# Patient Record
Sex: Male | Born: 1935 | Race: White | Hispanic: No | Marital: Married | Smoking: Former smoker
Health system: Southern US, Community
[De-identification: ages and names within clinical notes are randomized; demographics above are authoritative.]

## PROBLEM LIST (undated history)

## (undated) DIAGNOSIS — I639 Cerebral infarction, unspecified: Secondary | ICD-10-CM

## (undated) DIAGNOSIS — D649 Anemia, unspecified: Secondary | ICD-10-CM

## (undated) DIAGNOSIS — I6529 Occlusion and stenosis of unspecified carotid artery: Secondary | ICD-10-CM

## (undated) DIAGNOSIS — J189 Pneumonia, unspecified organism: Secondary | ICD-10-CM

## (undated) DIAGNOSIS — T4145XA Adverse effect of unspecified anesthetic, initial encounter: Secondary | ICD-10-CM

## (undated) DIAGNOSIS — M1990 Unspecified osteoarthritis, unspecified site: Secondary | ICD-10-CM

## (undated) DIAGNOSIS — I10 Essential (primary) hypertension: Secondary | ICD-10-CM

## (undated) DIAGNOSIS — I219 Acute myocardial infarction, unspecified: Secondary | ICD-10-CM

## (undated) DIAGNOSIS — T8859XA Other complications of anesthesia, initial encounter: Secondary | ICD-10-CM

## (undated) DIAGNOSIS — I2510 Atherosclerotic heart disease of native coronary artery without angina pectoris: Secondary | ICD-10-CM

## (undated) HISTORY — DX: Occlusion and stenosis of unspecified carotid artery: I65.29

## (undated) HISTORY — PX: EYE SURGERY: SHX253

## (undated) HISTORY — PX: JOINT REPLACEMENT: SHX530

## (undated) HISTORY — PX: CARPAL TUNNEL RELEASE: SHX101

## (undated) HISTORY — PX: APPENDECTOMY: SHX54

## (undated) HISTORY — PX: HAND SURGERY: SHX662

## (undated) HISTORY — PX: BACK SURGERY: SHX140

## (undated) HISTORY — PX: ROTATOR CUFF REPAIR: SHX139

---

## 2008-09-03 HISTORY — PX: CORONARY ARTERY BYPASS GRAFT: SHX141

## 2009-02-23 DIAGNOSIS — I219 Acute myocardial infarction, unspecified: Secondary | ICD-10-CM

## 2009-02-23 HISTORY — DX: Acute myocardial infarction, unspecified: I21.9

## 2009-02-24 ENCOUNTER — Ambulatory Visit: Payer: Self-pay | Admitting: Thoracic Surgery (Cardiothoracic Vascular Surgery)

## 2009-03-16 ENCOUNTER — Ambulatory Visit: Payer: Self-pay | Admitting: Thoracic Surgery (Cardiothoracic Vascular Surgery)

## 2009-04-04 ENCOUNTER — Ambulatory Visit: Payer: Self-pay | Admitting: Thoracic Surgery (Cardiothoracic Vascular Surgery)

## 2009-05-10 ENCOUNTER — Ambulatory Visit: Payer: Self-pay | Admitting: Thoracic Surgery (Cardiothoracic Vascular Surgery)

## 2009-05-17 ENCOUNTER — Ambulatory Visit: Payer: Self-pay | Admitting: Thoracic Surgery (Cardiothoracic Vascular Surgery)

## 2011-01-10 ENCOUNTER — Other Ambulatory Visit: Payer: Self-pay | Admitting: Neurosurgery

## 2011-01-10 DIAGNOSIS — M545 Low back pain: Secondary | ICD-10-CM

## 2011-01-15 ENCOUNTER — Other Ambulatory Visit: Payer: Self-pay

## 2011-01-16 ENCOUNTER — Ambulatory Visit
Admission: RE | Admit: 2011-01-16 | Discharge: 2011-01-16 | Disposition: A | Discharge status: Home / Self Care | Payer: Medicare PPO | Source: Ambulatory Visit | Attending: Neurosurgery | Admitting: Neurosurgery

## 2011-01-16 DIAGNOSIS — M545 Low back pain: Secondary | ICD-10-CM

## 2011-03-08 ENCOUNTER — Ambulatory Visit (HOSPITAL_COMMUNITY)
Admission: RE | Admit: 2011-03-08 | Discharge: 2011-03-08 | Disposition: A | Discharge status: Home / Self Care | Payer: Medicare PPO | Source: Ambulatory Visit | Attending: Neurosurgery | Admitting: Neurosurgery

## 2011-03-08 ENCOUNTER — Other Ambulatory Visit (HOSPITAL_COMMUNITY): Payer: Self-pay | Admitting: Neurosurgery

## 2011-03-08 ENCOUNTER — Encounter (HOSPITAL_COMMUNITY)
Admission: RE | Admit: 2011-03-08 | Discharge: 2011-03-08 | Disposition: A | Discharge status: Home / Self Care | Payer: Medicare PPO | Source: Ambulatory Visit | Attending: Neurosurgery | Admitting: Neurosurgery

## 2011-03-08 DIAGNOSIS — Z01818 Encounter for other preprocedural examination: Secondary | ICD-10-CM | POA: Insufficient documentation

## 2011-03-08 DIAGNOSIS — Z01812 Encounter for preprocedural laboratory examination: Secondary | ICD-10-CM | POA: Insufficient documentation

## 2011-03-08 DIAGNOSIS — Z01810 Encounter for preprocedural cardiovascular examination: Secondary | ICD-10-CM | POA: Insufficient documentation

## 2011-03-08 DIAGNOSIS — M48061 Spinal stenosis, lumbar region without neurogenic claudication: Secondary | ICD-10-CM | POA: Insufficient documentation

## 2011-03-08 DIAGNOSIS — M419 Scoliosis, unspecified: Secondary | ICD-10-CM

## 2011-03-08 DIAGNOSIS — M4120 Other idiopathic scoliosis, site unspecified: Secondary | ICD-10-CM | POA: Insufficient documentation

## 2011-03-08 DIAGNOSIS — M4807 Spinal stenosis, lumbosacral region: Secondary | ICD-10-CM

## 2011-03-08 LAB — CBC
HCT: 39.2 % (ref 39.0–52.0)
MCH: 30.3 pg (ref 26.0–34.0)
MCHC: 33.7 g/dL (ref 30.0–36.0)
MCV: 89.9 fL (ref 78.0–100.0)
Platelets: 157 10*3/uL (ref 150–400)
RDW: 13.1 % (ref 11.5–15.5)
WBC: 5.8 10*3/uL (ref 4.0–10.5)

## 2011-03-08 LAB — DIFFERENTIAL
Eosinophils Absolute: 0.2 10*3/uL (ref 0.0–0.7)
Eosinophils Relative: 4 % (ref 0–5)
Lymphocytes Relative: 24 % (ref 12–46)
Lymphs Abs: 1.4 10*3/uL (ref 0.7–4.0)
Monocytes Absolute: 0.5 10*3/uL (ref 0.1–1.0)

## 2011-03-08 LAB — BASIC METABOLIC PANEL
CO2: 31 mEq/L (ref 19–32)
Chloride: 101 mEq/L (ref 96–112)
Potassium: 4.2 mEq/L (ref 3.5–5.1)

## 2011-03-08 LAB — SURGICAL PCR SCREEN
MRSA, PCR: NEGATIVE
Staphylococcus aureus: NEGATIVE

## 2011-03-12 ENCOUNTER — Inpatient Hospital Stay (HOSPITAL_COMMUNITY)
Admission: RE | Admit: 2011-03-12 | Discharge: 2011-03-18 | DRG: 457 | Disposition: A | Discharge status: Home / Self Care | Payer: Medicare PPO | Source: Ambulatory Visit | Attending: Neurosurgery | Admitting: Neurosurgery

## 2011-03-12 ENCOUNTER — Inpatient Hospital Stay (HOSPITAL_COMMUNITY): Payer: Medicare PPO

## 2011-03-12 DIAGNOSIS — M4120 Other idiopathic scoliosis, site unspecified: Principal | ICD-10-CM | POA: Diagnosis present

## 2011-03-12 DIAGNOSIS — D62 Acute posthemorrhagic anemia: Secondary | ICD-10-CM | POA: Diagnosis not present

## 2011-03-12 DIAGNOSIS — M48061 Spinal stenosis, lumbar region without neurogenic claudication: Secondary | ICD-10-CM | POA: Diagnosis present

## 2011-03-13 LAB — CBC
MCH: 30.9 pg (ref 26.0–34.0)
MCV: 90.2 fL (ref 78.0–100.0)
Platelets: 99 10*3/uL — ABNORMAL LOW (ref 150–400)
RBC: 2.85 MIL/uL — ABNORMAL LOW (ref 4.22–5.81)

## 2011-03-13 LAB — POCT I-STAT 4, (NA,K, GLUC, HGB,HCT)
Hemoglobin: 9.9 g/dL — ABNORMAL LOW (ref 13.0–17.0)
Sodium: 140 mEq/L (ref 135–145)

## 2011-03-13 LAB — BASIC METABOLIC PANEL
CO2: 28 mEq/L (ref 19–32)
Calcium: 7.6 mg/dL — ABNORMAL LOW (ref 8.4–10.5)
Creatinine, Ser: 0.71 mg/dL (ref 0.50–1.35)
Glucose, Bld: 222 mg/dL — ABNORMAL HIGH (ref 70–99)

## 2011-03-15 LAB — GLUCOSE, CAPILLARY: Glucose-Capillary: 136 mg/dL — ABNORMAL HIGH (ref 70–99)

## 2011-03-15 LAB — TYPE AND SCREEN
ABO/RH(D): B NEG
Antibody Screen: NEGATIVE

## 2011-03-16 LAB — GLUCOSE, CAPILLARY: Glucose-Capillary: 146 mg/dL — ABNORMAL HIGH (ref 70–99)

## 2011-03-22 NOTE — Op Note (Signed)
NAME:  [name], [name] NO.:  [id number]  MEDICAL RECORD NO.:  [id number]  LOCATION:  3013                         FACILITY:  [facility]  PHYSICIAN:  [name], M.D.    DATE OF BIRTH:  15-Jun-1933  DATE OF PROCEDURE:  03/12/2011 DATE OF DISCHARGE:                              OPERATIVE REPORT   PREOPERATIVE DIAGNOSIS:  L1-L5 degenerative scoliosis with stenosis.  POSTOPERATIVE DIAGNOSIS:  L1-L5 degenerative scoliosis with stenosis.  PROCEDURE NOTE:  L1-2, L2-3, L3-4, L4-5 decompressive laminectomy, bilateral L1, L2, L3, L4, L5 decompressive foraminotomies, more than would be required for simple interbody fusion alone.  L1-2, L2-3, L3-4, L4-5 posterior lumbar interbody fusion utilizing tangent interbody allograft wedge, Telamon interbody PEEK cage, and local autografting. L1-L5 posterolateral arthrodesis utilizing segmental pedicle screw fixation and local autograft.  SURGEON:  Brandon Maser. Calab Sachse, MD  ASSISTANT:  Donalee Citrin, MD  ANESTHESIA:  General endotracheal.  INDICATION:  [name] is a 77 year old male with history of intractable back and lower extremity pain, failing conservative measure. Workup demonstrates evidence of a progressive degenerative scoliosis with stenosis at multiple levels.  The patient has failed conservative management, presents now for L1-L5 decompression and fusion in hopes of improving his symptoms.  OPERATIVE NOTE:  The patient was brought to the operative room, placed on operating table in supine position.  After adequate level of anesthesia was achieved, the patient was placed prone onto Wilson frame, appropriately padded the patient's lumbar regions and prepped and draped sterilely.  A #10 blade was used to make a curvilinear skin incision overlying the L1, 2, 3, 4, and 5 levels.  This was carried down sharply in the midline.  Subperiosteal dissection was performed exposing the lamina and facet joints in the transverse  processes at the aforementioned levels.  Deep self-retaining retractor was place. Intraoperative fluoroscopy was used.  Levels were confirmed. Decompressive laminectomy and foraminotomy was then performed using Leksell rongeurs, Kerrison, and high-speed drill to remove the entire lamina at L1, L2, L3, and L4 as well as the superior aspect of lamina of L5.  Inferior facetectomies of L1, L2, L3 and L4 were performed bilaterally.  Superior facetectomies of L2, L3, L4, and L5 were performed bilaterally.  All bone was cleaned and will be used in later autograft.  Ligamentum flavum and epidural scar was then elevated and resected in usual fashion.  The underlying thecal sac and exiting L1, L2, L3, L4, and L5 nerve roots were identified and widely decompressed throughout their foramina bilaterally.  At this point, a very thorough depression was achieved.  Epidural venous plexus was coagulated and cut. Starting first at L1-2 disk space, then incised with 15 blade in rectangular fashion.  Wide disk space clean-out was achieved using pituitary rongeurs and Epstein curettes.  Disk space was then distracted to 8 mm.  The disk space was then incised on the contralateral side. Diskectomies also performed.  Disk space was then reamed and then cut with 8-mm tangent through soft tissue interspace.  An 8  x 22-mm Telamon cage packed with morselized autograft was then packed into place, recessed approximately 2 mm from the anterior cortical margin. Distractor was removed from the patient's left side.  Thecal sac and nerve roots tracked on the left side.  Disk space once again reamed and then cut with 10-mm tangent instrument.  Soft tissue removed in interspace.  An 8 x 22-mm Telamon cage was impacted into place after the interspace had been packed with morselized autograft.  The procedure then repeated at L2-3, L3-4, and L4-5 using one 8-mm tangent wedge and one 8 x 22-mm Telamon cage at each of the  additional levels.  Pedicles at L2-L1, L2-L3, L4-5 were then identified using surface landmarks and intraoperative fluoroscopy.  Superficial bone around the pedicles were then removed with high-speed drill.  Pedicle was then probed using pedicle awl.  Each pedicle awl track was then tapped with 5.25-mm screw tap.  Each screw hole was then probed and found to be solidly within the bone.  A 6.75 x 45-mm radius screw was placed bilaterally at L1, L2, L3, L4, and L5.  Transverse processes at L1, L2, L3, L4, and L5 were then decorticated bilaterally and morselized autograft was packed posterolaterally for later fusion.  Short segment titanium rod was then contoured and placed over screw heads from L1-L5 bilaterally.  Locking caps engaged over the screw heads.  Locking caps were given final tightening with construct under compression.  Transverse connector was placed.  Final images revealed good position of bone grafts,  hardware at proper operative level with much improved alignment of the spine. Gelfoam was placed topically for hemostasis.  A medium Hemovac drain was left in interspace.  Wound was then closed in layers with Vicryl suture. Steri-Strips and sterile dressings were applied.  The patient tolerated the procedure well and he returned to the recovery room postoperatively.          ______________________________ [name], M.D.     HAP/MEDQ  D:  03/12/2011  T:  03/13/2011  Job:  [id number]  Electronically Signed by Julio Sicks M.D. on 03/22/2011 11:47:36 PM

## 2011-04-18 ENCOUNTER — Ambulatory Visit
Admission: RE | Admit: 2011-04-18 | Discharge: 2011-04-18 | Disposition: A | Discharge status: Home / Self Care | Payer: Medicare PPO | Source: Ambulatory Visit | Attending: Neurosurgery | Admitting: Neurosurgery

## 2011-04-18 ENCOUNTER — Other Ambulatory Visit: Payer: Self-pay | Admitting: Neurosurgery

## 2011-04-18 DIAGNOSIS — M545 Low back pain: Secondary | ICD-10-CM

## 2011-04-30 NOTE — Discharge Summary (Signed)
  NAME:  [name], [name] NO.:  [id number]  MEDICAL RECORD NO.:  [id number]  LOCATION:  3013                         FACILITY:  [facility]  PHYSICIAN:  Kathaleen Maser. Johnika Escareno, M.D.    DATE OF BIRTH:  11/06/1932  DATE OF ADMISSION:  03/12/2011 DATE OF DISCHARGE:  03/18/2011                              DISCHARGE SUMMARY   FINAL DIAGNOSIS:  L1 through L5 degenerative scoliosis with stenosis.  HISTORY OF PRESENT ILLNESS:  [name] is a 78 year old male with history of intractable back and bilateral lower extremity pain, failing conservative management.  Workup demonstrates evidence of progressive degenerative scoliosis with stenosis at multiple levels.  He presents now for L1 through L5 decompression and fusion with instrumentation.  HOSPITAL COURSE:  The patient was taken to the operating room where an uncomplicated L1 through L5 decompression and fusion with instrumentation was performed.  Postoperatively, the patient did well. His preoperative back and lower extremity pain were improved.  His strength and sensation were intact through testing.  He was gradually mobilized with physical and occupational therapy.  His wound continued to heal well.  He tolerated regular diet.  Bowel and bladder function were normal.  At the time of discharge, the patient is ambulating with the aid of a walker without difficulty.  His pain is well controlled.  CONDITION ON DISCHARGE:  Improved.  The patient will be discharged home and he will follow up in my office in 1 week.          ______________________________ Kathaleen Maser. Angee Gupton, M.D.     HAP/MEDQ  D:  04/03/2011  T:  04/03/2011  Job:  [id number]  Electronically Signed by Julio Sicks M.D. on 04/30/2011 07:44:40 AM

## 2011-08-04 DIAGNOSIS — J189 Pneumonia, unspecified organism: Secondary | ICD-10-CM

## 2011-08-04 HISTORY — DX: Pneumonia, unspecified organism: J18.9

## 2012-06-04 ENCOUNTER — Encounter (HOSPITAL_COMMUNITY)
Admission: RE | Admit: 2012-06-04 | Discharge: 2012-06-04 | Disposition: A | Discharge status: Home / Self Care | Payer: Medicare PPO | Source: Ambulatory Visit | Attending: Orthopedic Surgery | Admitting: Orthopedic Surgery

## 2012-06-04 ENCOUNTER — Ambulatory Visit (HOSPITAL_COMMUNITY)
Admission: RE | Admit: 2012-06-04 | Discharge: 2012-06-04 | Disposition: A | Discharge status: Home / Self Care | Payer: Medicare PPO | Source: Ambulatory Visit | Attending: Orthopedic Surgery | Admitting: Orthopedic Surgery

## 2012-06-04 ENCOUNTER — Encounter (HOSPITAL_COMMUNITY): Payer: Self-pay

## 2012-06-04 ENCOUNTER — Encounter (HOSPITAL_COMMUNITY): Payer: Self-pay | Admitting: Pharmacy Technician

## 2012-06-04 DIAGNOSIS — Z01818 Encounter for other preprocedural examination: Secondary | ICD-10-CM | POA: Insufficient documentation

## 2012-06-04 DIAGNOSIS — Z01810 Encounter for preprocedural cardiovascular examination: Secondary | ICD-10-CM | POA: Insufficient documentation

## 2012-06-04 DIAGNOSIS — Z01812 Encounter for preprocedural laboratory examination: Secondary | ICD-10-CM | POA: Insufficient documentation

## 2012-06-04 HISTORY — DX: Pneumonia, unspecified organism: J18.9

## 2012-06-04 HISTORY — DX: Atherosclerotic heart disease of native coronary artery without angina pectoris: I25.10

## 2012-06-04 HISTORY — DX: Cerebral infarction, unspecified: I63.9

## 2012-06-04 HISTORY — DX: Unspecified osteoarthritis, unspecified site: M19.90

## 2012-06-04 HISTORY — DX: Acute myocardial infarction, unspecified: I21.9

## 2012-06-04 HISTORY — DX: Anemia, unspecified: D64.9

## 2012-06-04 LAB — BASIC METABOLIC PANEL
Calcium: 9.8 mg/dL (ref 8.4–10.5)
GFR calc Af Amer: >90 mL/min (ref >90)
GFR calc non Af Amer: 86 mL/min — ABNORMAL LOW (ref >90)
Sodium: 140 mEq/L (ref 135–145)

## 2012-06-04 LAB — CBC
MCH: 31.7 pg (ref 26.0–34.0)
Platelets: 127 10*3/uL — ABNORMAL LOW (ref 150–400)
RBC: 4.54 MIL/uL (ref 4.22–5.81)
WBC: 6 10*3/uL (ref 4.0–10.5)

## 2012-06-04 LAB — APTT: aPTT: 29 seconds (ref 24–37)

## 2012-06-04 LAB — TYPE AND SCREEN: ABO/RH(D): B NEG

## 2012-06-04 LAB — SURGICAL PCR SCREEN
MRSA, PCR: NEGATIVE
Staphylococcus aureus: NEGATIVE

## 2012-06-04 LAB — PROTIME-INR: Prothrombin Time: 13 seconds (ref 11.6–15.2)

## 2012-06-04 NOTE — Progress Notes (Signed)
Chart left for anesthesia review of EKG, cardiac records.

## 2012-06-04 NOTE — Pre-Procedure Instructions (Signed)
[address] Mesiyah Cooprider  06/04/2012   Your procedure is scheduled on:  Wednesday October 16  Report to [facility] at 7:30 AM.  Call this number if you have problems the morning of surgery: [phone]   Remember:   Do not eat or drink:After Midnight.    Take these medicines the morning of surgery with A SIP OF WATER: *   Do not wear jewelry, make-up or nail polish.  Do not wear lotions, powders, or perfumes. You may wear deodorant.  Do not shave 48 hours prior to surgery. Men may shave face and neck.  Do not bring valuables to the hospital.  Contacts, dentures or bridgework may not be worn into surgery.  Leave suitcase in the car. After surgery it may be brought to your room.  For patients admitted to the hospital, checkout time is 11:00 AM the day of discharge.   Patients discharged the day of surgery will not be allowed to drive home.  Name and phone number of your driver: NA  Special Instructions: Shower using CHG 2 nights before surgery and the night before surgery.  If you shower the day of surgery use CHG.  Use special wash - you have one bottle of CHG for all showers.  You should use approximately 1/3 of the bottle for each shower.   Please read over the following fact sheets that you were given: Pain Booklet, Coughing and Deep Breathing, Blood Transfusion Information, Total Joint Packet and Surgical Site Infection Prevention

## 2012-06-05 ENCOUNTER — Encounter (HOSPITAL_COMMUNITY): Payer: Self-pay | Admitting: Vascular Surgery

## 2012-06-05 NOTE — Consult Note (Signed)
Anesthesia chart review: Patient is a 78 year old male posted for left total hip arthroplasty and right knee injection by Dr. Eulah Pont on 06/18/2012.  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 PCP is Dr. Mare Ferrari.    Cardiologist is Dr. Vernona Rieger, last visit was on 12/13/11.  He cleared patient from a cardiac stand point with low cardiac risk for this procedure.  Dr. Perry Mount clearance note states, "He has significant right carotid artery disease (severe) which he has not had treated and may increase the risk of surgery."  I reviewed this with Anesthesiologist Dr. Jacklynn Bue who felt that if Mr. Nordby wasn't experiencing any TIA/CVA symptoms then would plan to proceed.  I called and spoke with Mr. Lacko.  He reports that he has had known significant carotid disease since 2008.  He realizes that he is at increased risk for CVA, but is not interested in surgical intervention.  He denies numbness, tingling, weakness, acute/new speech or visual changes.  He reports he saw his Opthalmologist today and was told he has a left eye cataract which he plans to have removed after his recovers from this surgery.    EKG on 06/04/02 showed NSR, occasional PVCs, LAD, septal infarct (age undetermined).  His last cardiac cath was on 02/23/09 showing 3V CAD and is now s/p CABG.  His last echo was on 04/02/09 and showed normal LV systolic function, mild LVH, EF 55-65%, trace tricuspid regurgitation.  CXR on 06/04/12 showed no active  disease.  Labs noted.  PLT 126.  Glucose 106.  If no significant change in his status then anticipate he can proceed as planned.  Shonna Chock, PA-C 06/05/12 1755

## 2012-06-11 ENCOUNTER — Encounter (HOSPITAL_COMMUNITY): Payer: Medicare PPO

## 2012-06-11 ENCOUNTER — Other Ambulatory Visit (HOSPITAL_COMMUNITY): Payer: Medicare PPO

## 2012-06-13 ENCOUNTER — Other Ambulatory Visit (HOSPITAL_COMMUNITY): Payer: Medicare PPO

## 2012-06-13 NOTE — H&P (Signed)
[facility] [address]   [address] [place], [place] [address] [phone] A Division of [facility]  Loreta Ave, M.D.   Robert A. Thurston Hole, M.D.   Burnell Blanks, M.D.   Eulas Post, M.D.   Lunette Stands, M.D Buford Dresser, M.D.  Charlsie Quest, M.D.   Estell Harpin, M.D.   Melina Fiddler, M.D. Genene Churn. Barry Dienes, PA-C            Kirstin A. Shepperson, PA-C Josh [place], PA-C [place], [place]   RE: Cortavius, [place]                                [phone]      DOB: 1932-11-02 PROGRESS NOTE: 06-10-12 Chief complaint: Left hip pain.  History of present illness: 78 year-old white male with a history of end stage DJD, left hip, and chronic pain.  Returns.  States that symptoms are unchanged from previous visit.  He is wanting to proceed with total hip replacement as scheduled.  He also has a known history of bilateral knee DJD and we had previously discussed doing a right knee intraarticular Depo-Medrol/Marcaine injection at the time of his surgery.  We have received pre-op clearances a couple of months ago, but he said that he was recently treated with Levaquin for pharyngitis and an ear infection.  Antibiotic completed a couple of days ago.  I spoke with the nurse practitioner at [facility] and she said that the Levaquin was given for bronchitis.  Patient not complaining of fevers or chills.  He does continue to have some cough which he feels is from postnasal drainage.   Current medications: Amlodipine, Aspirin, Carvedilol, ferrous sulfate, Furosemide, Lipitor, Metformin, Nitroglycerin sublingual tablets and Zanaflex.   Allergies: No known drug allergies.   Past medical/surgical history: Status post CABG, hypertension, Type II diabetes, hypercholesterolemia, iron deficiency anemia and multilevel lumbar fusion in 2012.  Review of systems: Patient currently admits to a nonproductive  cough with postnasal drainage.   Denies fevers or chills, chest pain, shortness of breath GI or GU issues.  Family history: Negative.     Social history: Patient is married and currently retired.     EXAMINATION: Height: 5?6.  Weight: 204 pounds.  Temperature: 97.5.  Blood pressure: 122/70 169/65.  Pulse: 57.  Pleasant white male, alert and oriented x 3 and in no acute distress.  Head is normocephalic, a traumatic.  PERRLA, EOMI.  Cervical spine unremarkable.  Lungs: CTA bilaterally.  Heart: RR, bradycardic. No murmurs noted.  Abdomen: Round.  NBS x 4.  Soft and non-tender.  Left hip he has about 10-15 degrees internal/external rotation with marked discomfort.  Gait is antalgic.  Bilateral calves non-tender.  Neurovascularly intact.  Skin warm and dry.      X-RAYS: Previous films again show end stage DJD with bone on bone periarticular spurs.    IMPRESSION: 1. End stage DJD, left hip.   2. Right knee DJD.    Continued       [facility] [address]   [address] [place], [place] [address] [phone] A Division of [facility]  Loreta Ave, M.D.   Robert A. Thurston Hole, M.D.   Burnell Blanks, M.D.   Eulas Post, M.D.   Lunette Stands, M.D Buford Dresser, M.D.  Charlsie Quest, M.D.  Estell Harpin, M.D.   Melina Fiddler, M.D. Genene Churn. Barry Dienes, PA-C            Kirstin A. Shepperson, PA-C Josh Tibbie, PA-C [place], [place]   RE: [place], [place]                                [phone]      DOB: [birth year] PROGRESS NOTE: 06-10-12 PLAN: We will proceed with left total hip replacement as scheduled.  Surgical procedure, along with potential rehab/recovery time discussed.  All questions answered.  At the time of his surgery we will do a right knee intraarticular Depo-Medrol/Marcaine injection.  He was instructed to return back to [facility] to make sure that the issues that he was recently  treated for have resolved.  Advised patient that if he does continue to have an upper or lower lingering respiratory infection that this could become a major problem in regards to his upcoming left hip surgery.  He voices understanding.  All questions answered.    Loreta Ave, M.D.   Electronically verified by Loreta Ave, M.D. DFM(JMO):jjh

## 2012-06-17 NOTE — Progress Notes (Signed)
Patient instructed to arrive 06/18/12 at 0645 am .

## 2012-06-18 ENCOUNTER — Encounter (HOSPITAL_COMMUNITY): Payer: Self-pay | Admitting: *Deleted

## 2012-06-18 ENCOUNTER — Inpatient Hospital Stay (HOSPITAL_COMMUNITY)
Admission: RE | Admit: 2012-06-18 | Discharge: 2012-06-20 | DRG: 470 | Disposition: A | Discharge status: Home / Self Care | Payer: Medicare PPO | Source: Ambulatory Visit | Attending: Orthopedic Surgery | Admitting: Orthopedic Surgery

## 2012-06-18 ENCOUNTER — Encounter (HOSPITAL_COMMUNITY): Payer: Self-pay | Admitting: Vascular Surgery

## 2012-06-18 ENCOUNTER — Encounter (HOSPITAL_COMMUNITY)
Admission: RE | Disposition: A | Discharge status: Home / Self Care | Payer: Self-pay | Source: Ambulatory Visit | Attending: Orthopedic Surgery

## 2012-06-18 ENCOUNTER — Encounter (HOSPITAL_COMMUNITY): Payer: Self-pay | Admitting: General Practice

## 2012-06-18 ENCOUNTER — Inpatient Hospital Stay (HOSPITAL_COMMUNITY): Payer: Medicare PPO

## 2012-06-18 ENCOUNTER — Inpatient Hospital Stay (HOSPITAL_COMMUNITY): Payer: Medicare PPO | Admitting: Vascular Surgery

## 2012-06-18 DIAGNOSIS — Z7982 Long term (current) use of aspirin: Secondary | ICD-10-CM

## 2012-06-18 DIAGNOSIS — Z96649 Presence of unspecified artificial hip joint: Secondary | ICD-10-CM

## 2012-06-18 DIAGNOSIS — Z951 Presence of aortocoronary bypass graft: Secondary | ICD-10-CM

## 2012-06-18 DIAGNOSIS — E119 Type 2 diabetes mellitus without complications: Secondary | ICD-10-CM | POA: Diagnosis present

## 2012-06-18 DIAGNOSIS — M169 Osteoarthritis of hip, unspecified: Principal | ICD-10-CM | POA: Diagnosis present

## 2012-06-18 DIAGNOSIS — M1610 Unilateral primary osteoarthritis, unspecified hip: Principal | ICD-10-CM | POA: Diagnosis present

## 2012-06-18 DIAGNOSIS — M1710 Unilateral primary osteoarthritis, unspecified knee: Secondary | ICD-10-CM | POA: Diagnosis present

## 2012-06-18 HISTORY — PX: INJECTION KNEE: SHX2446

## 2012-06-18 HISTORY — PX: TOTAL HIP ARTHROPLASTY: SHX124

## 2012-06-18 LAB — URINE MICROSCOPIC-ADD ON

## 2012-06-18 LAB — URINALYSIS, ROUTINE W REFLEX MICROSCOPIC
Glucose, UA: NEGATIVE mg/dL
Hgb urine dipstick: NEGATIVE
Protein, ur: 100 mg/dL — AB

## 2012-06-18 LAB — GLUCOSE, CAPILLARY
Glucose-Capillary: 110 mg/dL — ABNORMAL HIGH (ref 70–99)
Glucose-Capillary: 145 mg/dL — ABNORMAL HIGH (ref 70–99)

## 2012-06-18 SURGERY — ARTHROPLASTY, HIP, TOTAL,POSTERIOR APPROACH
Anesthesia: General | Site: Hip | Laterality: Right | Wound class: Clean

## 2012-06-18 MED ORDER — ACETAMINOPHEN 10 MG/ML IV SOLN
INTRAVENOUS | Status: AC
  Filled 2012-06-18: qty 100

## 2012-06-18 MED ORDER — CARVEDILOL 12.5 MG PO TABS
12.5000 mg | ORAL_TABLET | Freq: Two times a day (BID) | ORAL | Status: DC
  Administered 2012-06-18 – 2012-06-20 (×5): 12.5 mg via ORAL
  Filled 2012-06-18 (×7): qty 1

## 2012-06-18 MED ORDER — METHYLPREDNISOLONE ACETATE 80 MG/ML IJ SUSP
INTRAMUSCULAR | Status: AC
  Filled 2012-06-18: qty 1

## 2012-06-18 MED ORDER — INSULIN ASPART 100 UNIT/ML ~~LOC~~ SOLN
0.0000 [IU] | Freq: Three times a day (TID) | SUBCUTANEOUS | Status: DC
  Administered 2012-06-18 – 2012-06-19 (×2): 2 [IU] via SUBCUTANEOUS
  Administered 2012-06-19 – 2012-06-20 (×3): 3 [IU] via SUBCUTANEOUS
  Administered 2012-06-20: 2 [IU] via SUBCUTANEOUS
  Filled 2012-06-18: qty 3

## 2012-06-18 MED ORDER — MIDAZOLAM HCL 5 MG/5ML IJ SOLN
INTRAMUSCULAR | Status: DC | PRN
  Administered 2012-06-18: 1 mg via INTRAVENOUS

## 2012-06-18 MED ORDER — ONDANSETRON HCL 4 MG PO TABS: 4.0000 mg | ORAL_TABLET | Freq: Four times a day (QID) | ORAL | Status: DC | PRN

## 2012-06-18 MED ORDER — METFORMIN HCL 500 MG PO TABS
500.0000 mg | ORAL_TABLET | Freq: Two times a day (BID) | ORAL | Status: DC
  Administered 2012-06-18 – 2012-06-20 (×5): 500 mg via ORAL
  Filled 2012-06-18 (×7): qty 1

## 2012-06-18 MED ORDER — FERROUS SULFATE 325 (65 FE) MG PO TABS
325.0000 mg | ORAL_TABLET | Freq: Every evening | ORAL | Status: DC
  Administered 2012-06-18 – 2012-06-19 (×2): 325 mg via ORAL
  Filled 2012-06-18 (×4): qty 1

## 2012-06-18 MED ORDER — METOCLOPRAMIDE HCL 10 MG PO TABS: 5.0000 mg | ORAL_TABLET | Freq: Three times a day (TID) | ORAL | Status: DC | PRN

## 2012-06-18 MED ORDER — ACETAMINOPHEN 10 MG/ML IV SOLN: 1000.0000 mg | Freq: Once | INTRAVENOUS | Status: DC | PRN

## 2012-06-18 MED ORDER — CEFAZOLIN SODIUM 1-5 GM-% IV SOLN
1.0000 g | Freq: Three times a day (TID) | INTRAVENOUS | Status: AC
  Administered 2012-06-18 (×2): 1 g via INTRAVENOUS
  Filled 2012-06-18 (×3): qty 50

## 2012-06-18 MED ORDER — OXYCODONE HCL 5 MG PO TABS
5.0000 mg | ORAL_TABLET | ORAL | Status: DC | PRN
  Administered 2012-06-19 – 2012-06-20 (×3): 5 mg via ORAL
  Filled 2012-06-18: qty 1
  Filled 2012-06-18: qty 2
  Filled 2012-06-18: qty 1

## 2012-06-18 MED ORDER — ALUM & MAG HYDROXIDE-SIMETH 200-200-20 MG/5ML PO SUSP
30.0000 mL | ORAL | Status: DC | PRN
  Administered 2012-06-19 (×2): 30 mL via ORAL
  Filled 2012-06-18 (×2): qty 30

## 2012-06-18 MED ORDER — DOCUSATE SODIUM 100 MG PO CAPS
100.0000 mg | ORAL_CAPSULE | Freq: Two times a day (BID) | ORAL | Status: DC
  Administered 2012-06-18 – 2012-06-20 (×4): 100 mg via ORAL
  Filled 2012-06-18 (×6): qty 1

## 2012-06-18 MED ORDER — FUROSEMIDE 40 MG PO TABS
40.0000 mg | ORAL_TABLET | Freq: Every day | ORAL | Status: DC
  Administered 2012-06-18 – 2012-06-20 (×3): 40 mg via ORAL
  Filled 2012-06-18 (×3): qty 1

## 2012-06-18 MED ORDER — FENTANYL CITRATE 0.05 MG/ML IJ SOLN
INTRAMUSCULAR | Status: DC | PRN
  Administered 2012-06-18: 50 ug via INTRAVENOUS
  Administered 2012-06-18: 150 ug via INTRAVENOUS
  Administered 2012-06-18: 50 ug via INTRAVENOUS

## 2012-06-18 MED ORDER — HYDROMORPHONE HCL PF 1 MG/ML IJ SOLN
INTRAMUSCULAR | Status: AC
  Filled 2012-06-18: qty 1

## 2012-06-18 MED ORDER — ACETAMINOPHEN 325 MG PO TABS: 650.0000 mg | ORAL_TABLET | Freq: Four times a day (QID) | ORAL | Status: DC | PRN

## 2012-06-18 MED ORDER — WARFARIN SODIUM 7.5 MG PO TABS
7.5000 mg | ORAL_TABLET | Freq: Once | ORAL | Status: AC
  Administered 2012-06-18: 7.5 mg via ORAL
  Filled 2012-06-18: qty 1

## 2012-06-18 MED ORDER — NEOSTIGMINE METHYLSULFATE 1 MG/ML IJ SOLN
INTRAMUSCULAR | Status: DC | PRN
  Administered 2012-06-18: 4 mg via INTRAVENOUS

## 2012-06-18 MED ORDER — ENOXAPARIN SODIUM 40 MG/0.4ML ~~LOC~~ SOLN
40.0000 mg | SUBCUTANEOUS | Status: DC
  Administered 2012-06-19 – 2012-06-20 (×2): 40 mg via SUBCUTANEOUS
  Filled 2012-06-18 (×3): qty 0.4

## 2012-06-18 MED ORDER — ONDANSETRON HCL 4 MG/2ML IJ SOLN
4.0000 mg | Freq: Four times a day (QID) | INTRAMUSCULAR | Status: DC | PRN
  Administered 2012-06-19: 4 mg via INTRAVENOUS
  Filled 2012-06-18: qty 2

## 2012-06-18 MED ORDER — METHOCARBAMOL 500 MG PO TABS
500.0000 mg | ORAL_TABLET | Freq: Four times a day (QID) | ORAL | Status: DC | PRN
  Administered 2012-06-18 – 2012-06-20 (×3): 500 mg via ORAL
  Filled 2012-06-18 (×3): qty 1

## 2012-06-18 MED ORDER — HYDROMORPHONE HCL PF 1 MG/ML IJ SOLN
1.0000 mg | INTRAMUSCULAR | Status: DC | PRN
  Administered 2012-06-18 – 2012-06-19 (×3): 1 mg via INTRAVENOUS
  Filled 2012-06-18 (×3): qty 1

## 2012-06-18 MED ORDER — GLYCOPYRROLATE 0.2 MG/ML IJ SOLN
INTRAMUSCULAR | Status: DC | PRN
  Administered 2012-06-18: .8 mg via INTRAVENOUS

## 2012-06-18 MED ORDER — ACETAMINOPHEN 650 MG RE SUPP: 650.0000 mg | Freq: Four times a day (QID) | RECTAL | Status: DC | PRN

## 2012-06-18 MED ORDER — ONDANSETRON HCL 4 MG/2ML IJ SOLN
INTRAMUSCULAR | Status: DC | PRN
  Administered 2012-06-18: 4 mg via INTRAVENOUS

## 2012-06-18 MED ORDER — POTASSIUM CHLORIDE IN NACL 20-0.9 MEQ/L-% IV SOLN
INTRAVENOUS | Status: DC
  Administered 2012-06-18 – 2012-06-19 (×2): via INTRAVENOUS
  Filled 2012-06-18 (×6): qty 1000

## 2012-06-18 MED ORDER — METOCLOPRAMIDE HCL 5 MG/ML IJ SOLN: 5.0000 mg | Freq: Three times a day (TID) | INTRAMUSCULAR | Status: DC | PRN

## 2012-06-18 MED ORDER — HYDROMORPHONE HCL PF 1 MG/ML IJ SOLN
0.2500 mg | INTRAMUSCULAR | Status: DC | PRN
  Administered 2012-06-18 (×2): 0.5 mg via INTRAVENOUS

## 2012-06-18 MED ORDER — LACTATED RINGERS IV SOLN
INTRAVENOUS | Status: DC
  Administered 2012-06-18: 09:00:00 via INTRAVENOUS

## 2012-06-18 MED ORDER — SODIUM CHLORIDE 0.9 % IR SOLN
Status: DC | PRN
  Administered 2012-06-18: 1000 mL

## 2012-06-18 MED ORDER — LIDOCAINE HCL (CARDIAC) 20 MG/ML IV SOLN
INTRAVENOUS | Status: DC | PRN
  Administered 2012-06-18: 60 mg via INTRAVENOUS

## 2012-06-18 MED ORDER — ROCURONIUM BROMIDE 100 MG/10ML IV SOLN
INTRAVENOUS | Status: DC | PRN
  Administered 2012-06-18: 40 mg via INTRAVENOUS

## 2012-06-18 MED ORDER — ATORVASTATIN CALCIUM 80 MG PO TABS
80.0000 mg | ORAL_TABLET | Freq: Every evening | ORAL | Status: DC
Start: 2012-06-18 — End: 2012-06-20
  Administered 2012-06-18 – 2012-06-19 (×2): 80 mg via ORAL
  Filled 2012-06-18 (×3): qty 1

## 2012-06-18 MED ORDER — BUPIVACAINE HCL (PF) 0.25 % IJ SOLN
INTRAMUSCULAR | Status: AC
  Filled 2012-06-18: qty 30

## 2012-06-18 MED ORDER — WARFARIN VIDEO
Freq: Once | Status: AC
  Administered 2012-06-19: 10:00:00

## 2012-06-18 MED ORDER — WARFARIN - PHARMACIST DOSING INPATIENT
Freq: Every day | Status: DC
  Administered 2012-06-18: 18:00:00

## 2012-06-18 MED ORDER — AMLODIPINE BESYLATE 10 MG PO TABS
10.0000 mg | ORAL_TABLET | Freq: Every day | ORAL | Status: DC
  Administered 2012-06-18 – 2012-06-20 (×3): 10 mg via ORAL
  Filled 2012-06-18 (×4): qty 1

## 2012-06-18 MED ORDER — BUPIVACAINE-EPINEPHRINE PF 0.25-1:200000 % IJ SOLN
INTRAMUSCULAR | Status: DC | PRN
  Administered 2012-06-18: 10 mL

## 2012-06-18 MED ORDER — LACTATED RINGERS IV SOLN
INTRAVENOUS | Status: DC | PRN
  Administered 2012-06-18 (×2): via INTRAVENOUS

## 2012-06-18 MED ORDER — METHYLPREDNISOLONE ACETATE 80 MG/ML IJ SUSP
INTRAMUSCULAR | Status: DC | PRN
  Administered 2012-06-18: 80 mg via INTRA_ARTICULAR

## 2012-06-18 MED ORDER — SENNOSIDES-DOCUSATE SODIUM 8.6-50 MG PO TABS: 1.0000 | ORAL_TABLET | Freq: Every evening | ORAL | Status: DC | PRN

## 2012-06-18 MED ORDER — WARFARIN SODIUM 5 MG PO TABS
5.0000 mg | ORAL_TABLET | Freq: Once | ORAL | Status: DC
  Filled 2012-06-18: qty 1

## 2012-06-18 MED ORDER — PROPOFOL 10 MG/ML IV BOLUS
INTRAVENOUS | Status: DC | PRN
  Administered 2012-06-18: 150 mg via INTRAVENOUS

## 2012-06-18 MED ORDER — PHENOL 1.4 % MT LIQD: 1.0000 | OROMUCOSAL | Status: DC | PRN

## 2012-06-18 MED ORDER — MENTHOL 3 MG MT LOZG: 1.0000 | LOZENGE | OROMUCOSAL | Status: DC | PRN

## 2012-06-18 MED ORDER — COUMADIN BOOK
Freq: Once | Status: AC
  Administered 2012-06-19: 08:00:00
  Filled 2012-06-18: qty 1

## 2012-06-18 MED ORDER — BISACODYL 10 MG RE SUPP: 10.0000 mg | Freq: Every day | RECTAL | Status: DC | PRN

## 2012-06-18 MED ORDER — DEXTROSE 5 % IV SOLN
500.0000 mg | Freq: Four times a day (QID) | INTRAVENOUS | Status: DC | PRN
  Filled 2012-06-18: qty 5

## 2012-06-18 MED ORDER — CEFAZOLIN SODIUM-DEXTROSE 2-3 GM-% IV SOLR
2.0000 g | INTRAVENOUS | Status: AC
  Administered 2012-06-18: 2 g via INTRAVENOUS
  Filled 2012-06-18: qty 50

## 2012-06-18 MED ORDER — ONDANSETRON HCL 4 MG/2ML IJ SOLN: 4.0000 mg | Freq: Once | INTRAMUSCULAR | Status: DC | PRN

## 2012-06-18 SURGICAL SUPPLY — 66 items
BLADE SAW SAG 73X25 THK (BLADE) ×1
BLADE SAW SGTL 73X25 THK (BLADE) ×2 IMPLANT
BOOTCOVER CLEANROOM LRG (PROTECTIVE WEAR) ×6 IMPLANT
BRUSH FEMORAL CANAL (MISCELLANEOUS) IMPLANT
CLOTH BEACON ORANGE TIMEOUT ST (SAFETY) ×3 IMPLANT
COVER BACK TABLE 24X17X13 BIG (DRAPES) IMPLANT
COVER SURGICAL LIGHT HANDLE (MISCELLANEOUS) ×3 IMPLANT
DRAPE INCISE IOBAN 66X45 STRL (DRAPES) IMPLANT
DRAPE ORTHO SPLIT 77X108 STRL (DRAPES) ×2
DRAPE SURG ORHT 6 SPLT 77X108 (DRAPES) ×4 IMPLANT
DRAPE U-SHAPE 47X51 STRL (DRAPES) ×3 IMPLANT
DRILL BIT 7/64X5 (BIT) ×3 IMPLANT
DRSG MEPILEX BORDER 4X12 (GAUZE/BANDAGES/DRESSINGS) ×3 IMPLANT
DRSG PAD ABDOMINAL 8X10 ST (GAUZE/BANDAGES/DRESSINGS) IMPLANT
DURAPREP 26ML APPLICATOR (WOUND CARE) ×3 IMPLANT
ELECT BLADE 6.5 EXT (BLADE) ×3 IMPLANT
ELECT CAUTERY BLADE 6.4 (BLADE) ×3 IMPLANT
ELECT REM PT RETURN 9FT ADLT (ELECTROSURGICAL) ×3
ELECTRODE REM PT RTRN 9FT ADLT (ELECTROSURGICAL) ×2 IMPLANT
EVACUATOR 1/8 PVC DRAIN (DRAIN) IMPLANT
FACESHIELD LNG OPTICON STERILE (SAFETY) ×6 IMPLANT
GAUZE XEROFORM 5X9 LF (GAUZE/BANDAGES/DRESSINGS) IMPLANT
GLOVE BIOGEL PI IND STRL 7.0 (GLOVE) ×4 IMPLANT
GLOVE BIOGEL PI IND STRL 8 (GLOVE) ×2 IMPLANT
GLOVE BIOGEL PI INDICATOR 7.0 (GLOVE) ×2
GLOVE BIOGEL PI INDICATOR 8 (GLOVE) ×1
GLOVE ORTHO TXT STRL SZ7.5 (GLOVE) ×6 IMPLANT
GLOVE SURG SS PI 6.5 STRL IVOR (GLOVE) ×3 IMPLANT
GLOVE SURG SS PI 7.0 STRL IVOR (GLOVE) ×3 IMPLANT
GOWN PREVENTION PLUS XLARGE (GOWN DISPOSABLE) ×6 IMPLANT
GOWN STRL NON-REIN LRG LVL3 (GOWN DISPOSABLE) ×6 IMPLANT
GOWN STRL REIN 2XL XLG LVL4 (GOWN DISPOSABLE) ×3 IMPLANT
GOWN STRL REIN XL XLG (GOWN DISPOSABLE) ×3 IMPLANT
HANDPIECE INTERPULSE COAX TIP (DISPOSABLE)
KIT BASIN OR (CUSTOM PROCEDURE TRAY) ×3 IMPLANT
KIT ROOM TURNOVER OR (KITS) ×3 IMPLANT
MANIFOLD NEPTUNE II (INSTRUMENTS) ×3 IMPLANT
NEEDLE HYPO 25GX1X1/2 BEV (NEEDLE) ×3 IMPLANT
NS IRRIG 1000ML POUR BTL (IV SOLUTION) ×3 IMPLANT
PACK TOTAL JOINT (CUSTOM PROCEDURE TRAY) ×3 IMPLANT
PAD ARMBOARD 7.5X6 YLW CONV (MISCELLANEOUS) ×6 IMPLANT
PASSER SUT SWANSON 36MM LOOP (INSTRUMENTS) ×3 IMPLANT
PILLOW ABDUCTION HIP (SOFTGOODS) ×3 IMPLANT
PRESSURIZER FEMORAL UNIV (MISCELLANEOUS) IMPLANT
SET HNDPC FAN SPRY TIP SCT (DISPOSABLE) IMPLANT
SPONGE GAUZE 4X4 12PLY (GAUZE/BANDAGES/DRESSINGS) IMPLANT
SPONGE LAP 4X18 X RAY DECT (DISPOSABLE) IMPLANT
STAPLER VISISTAT 35W (STAPLE) ×3 IMPLANT
SUCTION FRAZIER TIP 10 FR DISP (SUCTIONS) ×3 IMPLANT
SUT FIBERWIRE #2 38 REV NDL BL (SUTURE) ×9
SUT VIC AB 1 CTX 36 (SUTURE) ×2
SUT VIC AB 1 CTX36XBRD ANBCTR (SUTURE) ×4 IMPLANT
SUT VIC AB 2-0 CT1 27 (SUTURE)
SUT VIC AB 2-0 CT1 TAPERPNT 27 (SUTURE) IMPLANT
SUT VIC AB 2-0 SH 27 (SUTURE) ×2
SUT VIC AB 2-0 SH 27XBRD (SUTURE) ×4 IMPLANT
SUT VIC AB 3-0 SH 27 (SUTURE)
SUT VIC AB 3-0 SH 27X BRD (SUTURE) IMPLANT
SUTURE FIBERWR#2 38 REV NDL BL (SUTURE) ×6 IMPLANT
SYR 30ML SLIP (SYRINGE) ×3 IMPLANT
SYR CONTROL 10ML LL (SYRINGE) ×3 IMPLANT
TOWEL OR 17X24 6PK STRL BLUE (TOWEL DISPOSABLE) ×3 IMPLANT
TOWEL OR 17X26 10 PK STRL BLUE (TOWEL DISPOSABLE) ×3 IMPLANT
TOWER CARTRIDGE SMART MIX (DISPOSABLE) IMPLANT
TRAY FOLEY CATH 14FR (SET/KITS/TRAYS/PACK) ×3 IMPLANT
WATER STERILE IRR 1000ML POUR (IV SOLUTION) ×6 IMPLANT

## 2012-06-18 NOTE — Progress Notes (Signed)
Patient is complaining of hip pain "caused by that pillow" IE the abduction pillow between his legs.  Spoke with Dr. Eulah Pont in the OR, who confirmed that the pillow needs to stay in place if the surgery will be a success.  Explained this to the patient, question understanding.  Patient is refusing to move feet, but was observed moving them earlier.

## 2012-06-18 NOTE — Transfer of Care (Signed)
Immediate Anesthesia Transfer of Care Note  Patient: [name]  Procedure(s) Performed: Procedure(s) (LRB) with comments: TOTAL HIP ARTHROPLASTY (Left) - left total hip arthroplasty with right knee injection KNEE INJECTION (Right)  Patient Location: PACU  Anesthesia Type: General  Level of Consciousness: awake, alert  and oriented  Airway & Oxygen Therapy: Patient Spontanous Breathing and Patient connected to face mask oxygen  Post-op Assessment: Report given to PACU RN and Post -op Vital signs reviewed and stable  Post vital signs: Reviewed and stable  Complications: No apparent anesthesia complications

## 2012-06-18 NOTE — Brief Op Note (Signed)
06/18/2012  3:10 PM  PATIENT:  Terrilee Files  78 y.o. male  PRE-OPERATIVE DIAGNOSIS:  DEGENERATIVE ARTHRITIS PELVIS  POST-OPERATIVE DIAGNOSIS:  DEGENERATIVE ARTHRITIS PELVIS  PROCEDURE:  Procedure(s) (LRB) with comments: TOTAL HIP ARTHROPLASTY (Left) - left total hip arthroplasty with right knee injection KNEE INJECTION (Right)  SURGEON:  Surgeon(s) and Role:    * Loreta Ave, MD - Primary  PHYSICIAN ASSISTANT: Zonia Kief M   ANESTHESIA:   general  EBL:  Total I/O In: 1240 [P.O.:240; I.V.:1000] Out: 295 [Urine:220; Blood:75]  BLOOD ADMINISTERED:none  SPECIMEN:  No Specimen  DISPOSITION OF SPECIMEN:  N/A  COUNTS:  YES  TOURNIQUET:  * No tourniquets in log *  PATIENT DISPOSITION:  PACU - hemodynamically stable.

## 2012-06-18 NOTE — Preoperative (Signed)
Beta Blockers   Reason not to administer Beta Blockers:Not Applicable 

## 2012-06-18 NOTE — Progress Notes (Signed)
Pt arrived to floor from PACU A&0x3.    Denies pain or discomfort.  Instructed to call for pain med as needed.  Verbalized understanding.  Will continue to monitor.  Amanda Pea, Charity fundraiser.

## 2012-06-18 NOTE — Progress Notes (Addendum)
ANTICOAGULATION CONSULT NOTE - Initial Consult  Pharmacy Consult for couomadin Indication: VTE prophylaxis  Allergies  Allergen Reactions  . Erythromycin Base Itching and Rash    Patient Measurements: Height: 5' 10.08" (178 cm) Weight: 198 lb 6.6 oz (90 kg) IBW/kg (Calculated) : 73.18    Vital Signs: Temp: 98.9 F (37.2 C) (10/16 1449) Temp src: Oral (10/16 0731) BP: 129/45 mmHg (10/16 1449) Pulse Rate: 51  (10/16 1449)  Labs: No results found for this basename: HGB:2,HCT:3,PLT:3,APTT:3,LABPROT:3,INR:3,HEPARINUNFRC:3,CREATININE:3,CKTOTAL:3,CKMB:3,TROPONINI:3 in the last 72 hours  Estimated Creatinine Clearance: 87.4 ml/min (by C-G formula based on Cr of 0.76).   Medical History: Past Medical History  Diagnosis Date  . Pneumonia 08/2011    hx of  . Anginal pain   . Myocardial infarction 02/23/2009  . Coronary artery disease   . Diabetes mellitus   . Arthritis   . Anemia   . Stroke     dysarthria, now resolved '07  . Carotid artery stenosis     RICAS 80-99%, LICAS 60-79% 05/10/11 @ [facility] - Dr. Judithe Modest (refused surgical evaluation)    Medications:  Scheduled:    . acetaminophen      . amLODipine  10 mg Oral Daily  . atorvastatin  80 mg Oral QPM  . carvedilol  12.5 mg Oral BID WC  .  ceFAZolin (ANCEF) IV  1 g Intravenous Q8H  .  ceFAZolin (ANCEF) IV  2 g Intravenous On Call to OR  . coumadin book   Does not apply Once  . docusate sodium  100 mg Oral BID  . enoxaparin (LOVENOX) injection  40 mg Subcutaneous Q24H  . ferrous sulfate  325 mg Oral QPM  . furosemide  40 mg Oral Daily  . HYDROmorphone      . HYDROmorphone      . insulin aspart  0-15 Units Subcutaneous TID WC  . metFORMIN  500 mg Oral BID WC  . warfarin  5 mg Oral ONCE-1800  . warfarin   Does not apply Once  . Warfarin - Pharmacist Dosing Inpatient   Does not apply q1800    Assessment: 78 yr old male admitted for left total hip and right knee injection. Goal of Therapy:  INR 2-3      Plan:  1) Coumadin 7.5 mg tonight. 2) Daily PT/INR 3) Ordered coumadin booklet and video for pt.  Eugene Garnet 06/18/2012,4:59 PM

## 2012-06-18 NOTE — Progress Notes (Signed)
Pt arrived from PACU with small bleeding to Lt. Hip dsg.  Reported by PACU nurse also.  Will continue to monitor.Amanda Pea, RN.

## 2012-06-18 NOTE — Progress Notes (Signed)
Spoke with Dr. Eulah Pont regarding orders. Instructed to contact Fayrene Fearing, Dr. Greig Right PA. Paged and called Fayrene Fearing, no answer. Left message.

## 2012-06-18 NOTE — Anesthesia Preprocedure Evaluation (Addendum)
Anesthesia Evaluation  Patient identified by MRN, date of birth, ID band Patient awake    Reviewed: Allergy & Precautions, H&P , NPO status , Patient's Chart, lab work & pertinent test results, reviewed documented beta blocker date and time   History of Anesthesia Complications Negative for: history of anesthetic complications  Airway Mallampati: II TM Distance: >3 FB Neck ROM: Full    Dental  (+) Edentulous Upper, Edentulous Lower and Dental Advisory Given   Pulmonary pneumonia -, resolved, former smoker,  breath sounds clear to auscultation        Cardiovascular hypertension, Pt. on home beta blockers and Pt. on medications - angina+ CAD, + Past MI, + Cardiac Stents and + CABG Rhythm:Regular Rate:Normal     Neuro/Psych CVA (pt states "has trouble putting his words together sometimes"), Residual Symptoms negative psych ROS   GI/Hepatic negative GI ROS, Neg liver ROS,   Endo/Other  diabetes, Well Controlled, Type 2, Oral Hypoglycemic Agents  Renal/GU negative Renal ROS     Musculoskeletal negative musculoskeletal ROS (+)   Abdominal   Peds  Hematology negative hematology ROS (+)   Anesthesia Other Findings   Reproductive/Obstetrics negative OB ROS                        Anesthesia Physical Anesthesia Plan  ASA: III  Anesthesia Plan: General   Post-op Pain Management:    Induction: Intravenous  Airway Management Planned: Oral ETT  Additional Equipment:   Intra-op Plan:   Post-operative Plan: Extubation in OR  Informed Consent: I have reviewed the patients History and Physical, chart, labs and discussed the procedure including the risks, benefits and alternatives for the proposed anesthesia with the patient or authorized representative who has indicated his/her understanding and acceptance.   Dental advisory given  Plan Discussed with: CRNA and Surgeon  Anesthesia Plan Comments:  (DJD L. Hip CAD S/P CABG 2010 no angina Htn Type 2 DM glucose 106 S/P CVA 2009 with mild dysarthria Bilat Carotid Stenosis  Plan GA   Kipp Brood, MD)        Anesthesia Quick Evaluation

## 2012-06-18 NOTE — Interval H&P Note (Signed)
History and Physical Interval Note:  06/18/2012 8:30 AM  [name]  has presented today for surgery, with the diagnosis of DEGENERATIVE ARTHRITIS PELVIS  The various methods of treatment have been discussed with the patient and family. After consideration of risks, benefits and other options for treatment, the patient has consented to  Procedure(s) (LRB) with comments: TOTAL HIP ARTHROPLASTY (Left) KNEE INJECTION (Right) as a surgical intervention .  The patient's history has been reviewed, patient examined, no change in status, stable for surgery.  I have reviewed the patient's chart and labs.  Questions were answered to the patient's satisfaction.     [name] F

## 2012-06-19 ENCOUNTER — Encounter (HOSPITAL_COMMUNITY): Payer: Self-pay | Admitting: Orthopedic Surgery

## 2012-06-19 LAB — CBC
HCT: 36.3 % — ABNORMAL LOW (ref 39.0–52.0)
MCHC: 34.4 g/dL (ref 30.0–36.0)
Platelets: 158 10*3/uL (ref 150–400)
RDW: 12.6 % (ref 11.5–15.5)
WBC: 12.9 10*3/uL — ABNORMAL HIGH (ref 4.0–10.5)

## 2012-06-19 LAB — GLUCOSE, CAPILLARY: Glucose-Capillary: 146 mg/dL — ABNORMAL HIGH (ref 70–99)

## 2012-06-19 LAB — BASIC METABOLIC PANEL
BUN: 15 mg/dL (ref 6–23)
GFR calc Af Amer: >90 mL/min (ref >90)
GFR calc non Af Amer: >90 mL/min (ref >90)
Potassium: 4.5 mEq/L (ref 3.5–5.1)
Sodium: 137 mEq/L (ref 135–145)

## 2012-06-19 LAB — PROTIME-INR
INR: 1.06 (ref 0.00–1.49)
Prothrombin Time: 13.7 seconds (ref 11.6–15.2)

## 2012-06-19 MED ORDER — WARFARIN SODIUM 7.5 MG PO TABS
7.5000 mg | ORAL_TABLET | Freq: Once | ORAL | Status: AC
  Administered 2012-06-19: 7.5 mg via ORAL
  Filled 2012-06-19 (×2): qty 1

## 2012-06-19 NOTE — Progress Notes (Signed)
Physical Therapy Treatment Patient Details Name: [name] MRN: [id number] DOB: 1933-03-13 Today's Date: 06/19/2012 Time: 1400-1430 PT Time Calculation (min): 30 min  PT Assessment / Plan / Recommendation Comments on Treatment Session  Pt mobility and activity tolerance continue to be limited by dizziness.  Pt BP 141/61 in sitting.      Follow Up Recommendations  Home health PT;Supervision - Intermittent     Does the patient have the potential to tolerate intense rehabilitation     Barriers to Discharge        Equipment Recommendations  None recommended by PT    Recommendations for Other Services    Frequency 7X/week   Plan Discharge plan remains appropriate;Frequency remains appropriate    Precautions / Restrictions Precautions Precautions: Posterior Hip Precaution Booklet Issued: Yes (comment) Precaution Comments: Educated pt on posterior hip precautions.  Restrictions Weight Bearing Restrictions: Yes LLE Weight Bearing: Weight bearing as tolerated   Pertinent Vitals/Pain Pt c/o 6-7/10 pain in L hip. Premedicated, repositioned and applied ice for pain relief.      Mobility  Bed Mobility Bed Mobility: Supine to Sit;Sitting - Scoot to Edge of Bed;Sit to Supine Supine to Sit: 4: Min assist;HOB flat Sitting - Scoot to [place] of Bed: 4: Min assist Sit to Supine: 3: Mod assist;HOB flat Details for Bed Mobility Assistance: Cues for technique and assist for L LE.   Transfers Transfers: Not assessed Ambulation/Gait Ambulation/Gait Assistance: Not tested (comment) Wheelchair Mobility Wheelchair Mobility: No    Exercises Total Joint Exercises Ankle Circles/Pumps: AROM;Both;10 reps;Seated Quad Sets: 10 reps;Supine;Left Gluteal Sets: Both;10 reps;Supine Short Arc Quad: Left;5 reps;Supine Heel Slides: 5 reps;Left;Supine;AAROM   PT Diagnosis:    PT Problem List:   PT Treatment Interventions:     PT Goals Acute Rehab PT Goals PT Goal Formulation: With  patient Time For Goal Achievement: 06/26/12 Potential to Achieve Goals: Good Pt will go Supine/Side to Sit: with supervision PT Goal: Supine/Side to Sit - Progress: Progressing toward goal Pt will go Sit to Supine/Side: with supervision PT Goal: Sit to Supine/Side - Progress: Progressing toward goal Pt will Perform Home Exercise Program: with supervision, verbal cues required/provided PT Goal: Perform Home Exercise Program - Progress: Progressing toward goal  Visit Information       Subjective Data  Subjective: I still feel sick. Patient Stated Goal: Walk without pain   Cognition  Overall Cognitive Status: Appears within functional limits for tasks assessed/performed Arousal/Alertness: Awake/alert Orientation Level: Appears intact for tasks assessed Behavior During Session: [facility] for tasks performed    Balance  Balance Balance Assessed: Yes Static Sitting Balance Static Sitting - Balance Support: Feet supported Static Sitting - Level of Assistance: 7: Independent Static Sitting - Comment/# of Minutes: Pt c/o dizziness described as the room spinning in sitting.  Resolved in supine.    End of Session PT - End of Session Activity Tolerance: Patient limited by fatigue (Dizziness) Patient left: in bed;with call bell/phone within reach;with bed alarm set;with family/visitor present Nurse Communication: Mobility status;Precautions;Weight bearing status   GP     [name] 06/19/2012, 5:11 PM

## 2012-06-19 NOTE — Progress Notes (Signed)
ANTICOAGULATION CONSULT NOTE - Follow up Consult  Pharmacy Consult for couomadin Indication: VTE prophylaxis  Allergies  Allergen Reactions  . Erythromycin Base Itching and Rash    Patient Measurements: Height: 5' 10.08" (178 cm) Weight: 198 lb 6.6 oz (90 kg) IBW/kg (Calculated) : 73.18    Vital Signs: Temp: 99.2 F (37.3 C) (10/17 0635) Temp src: Oral (10/17 0635) BP: 139/88 mmHg (10/17 0635) Pulse Rate: 96  (10/17 0635)  Labs:  Basename 06/19/12 0555  HGB 12.5*  HCT 36.3*  PLT 158  APTT --  LABPROT 13.7  INR 1.06  HEPARINUNFRC --  CREATININE 0.59  CKTOTAL --  CKMB --  TROPONINI --    Estimated Creatinine Clearance: 87.4 ml/min (by C-G formula based on Cr of 0.59).   Medical History: Past Medical History  Diagnosis Date  . Pneumonia 08/2011    hx of  . Anginal pain   . Myocardial infarction 02/23/2009  . Coronary artery disease   . Diabetes mellitus   . Arthritis   . Anemia   . Stroke     dysarthria, now resolved '07  . Carotid artery stenosis     RICAS 80-99%, LICAS 60-79% 05/10/11 @ [facility] - Dr. Judithe Modest (refused surgical evaluation)    Medications:  Scheduled:     . acetaminophen      . amLODipine  10 mg Oral Daily  . atorvastatin  80 mg Oral QPM  . carvedilol  12.5 mg Oral BID WC  .  ceFAZolin (ANCEF) IV  1 g Intravenous Q8H  .  ceFAZolin (ANCEF) IV  2 g Intravenous On Call to OR  . coumadin book   Does not apply Once  . docusate sodium  100 mg Oral BID  . enoxaparin (LOVENOX) injection  40 mg Subcutaneous Q24H  . ferrous sulfate  325 mg Oral QPM  . furosemide  40 mg Oral Daily  . HYDROmorphone      . HYDROmorphone      . insulin aspart  0-15 Units Subcutaneous TID WC  . metFORMIN  500 mg Oral BID WC  . warfarin  7.5 mg Oral ONCE-1800  . warfarin   Does not apply Once  . Warfarin - Pharmacist Dosing Inpatient   Does not apply q1800  . DISCONTD: warfarin  5 mg Oral ONCE-1800    Admit Complaint: 78 y.o.  male  admitted 06/18/2012  s/p LTHA and R knee injection.  Pharmacy consulted to dose warfarin vor VTE Prophylaxis.  Overnight Events: 06/19/2012 Post op day 1, no bleeding noted, pain 3-6  Assessment: Anticoagulation: VTE Prophylaxis, warfarin, enoxaparin 40  (d/c when INR >/=1.8),   Cardiovascular: CAD, carotid artery disease, CVA: atorvastatin, coreg, amlodipine, lasix, Current Weight: 198 lb 6.6 oz (90 kg), HR & BP at goal.   Endocrinology: Diabetes, SSI, metformin: glucose 93-158  Neurology/MSK: Post op pain  Hematology / Oncology: Anemia, oral iron, Hgb 12.5  PTA Medication Issues: Home Meds Not Ordered: Aspirin, vitamin C  Best Practices: DVT Prophylaxis:  Warfarin, enoxaparin  Goal of Therapy:  INR 2-3   Plan:  1) Coumadin 7.5 mg tonight. 2) Daily PT/INR 3) Ordered coumadin booklet and video for pt.   Thank you for allowing pharmacy to be a part of this patients care team.  Lovenia Kim Pharm.D., BCPS Clinical Pharmacist 06/19/2012 9:14 AM Pager: [phone] Phone: [phone]

## 2012-06-19 NOTE — Op Note (Signed)
NAME:  [name], [name] NO.:  [id number]  MEDICAL RECORD NO.:  [id number]  LOCATION:  [id number]                        FACILITY:  [facility]  PHYSICIAN:  Loreta Ave, M.D. DATE OF BIRTH:  March 11, 1933  DATE OF PROCEDURE:  06/18/2012 DATE OF DISCHARGE:                              OPERATIVE REPORT   PREOPERATIVE DIAGNOSES: 1. Left hip end-stage degenerative arthritis. 2. Right knee end-stage degenerative arthritis.  POSTOPERATIVE DIAGNOSES: 1. Left hip end-stage degenerative arthritis. 2. Right knee end-stage degenerative arthritis.  PROCEDURE: 1. Left total hip replacement utilizing Stryker secure fit prosthesis.     A 58 mm press-fit acetabular component screw fixation x2.  40 mm     internal diameter liner.  A press-fit #9 femoral component, 132-     degree neck angle.  A 40+ 0 femoral head. 2. Intra-articular injection of the right knee with Depo-Medrol and     Marcaine.  SURGEON:  Loreta Ave, M.D.  ASSISTANT:  Genene Churn. Denton Meek., present throughout the entire case, necessary for timely completion of procedure.  ANESTHESIA:  General.  BLOOD LOSS:  200 mL.  BLOOD GIVEN:  None.  SPECIMENS:  None.  CULTURES:  None.  COMPLICATIONS:  None.  DRESSINGS:  Soft compressive with abduction pillow.  PROCEDURE:  The patient was brought to the operating room, placed on the operating table in supine position.  After adequate anesthesia had been obtained, the right knee was injected intra-articularly under sterile technique with Depo-Medrol and Marcaine.  The patient was then turned to a lateral position, left side up.  Prepped and draped in usual sterile fashion.  Incision along the shaft of femur extending posterosuperior. Skin and subcutaneous tissue divided.  Hemostasis with cautery. Iliotibial band exposed and incised.  Charnley retractor put in place. Neurovascular structures identified and protected.  External rotator capsule taken down off the  back of the intertrochanteric groove of the femur, tagged with FiberWire.  Hip dislocated posteriorly.  Grade 4 changes.  Femoral neck was cut 1 fingerbreadth above the lesser trochanter in line with definitive component.  Acetabulum exposed. Abundant synovial tissue, redundant labrum all debrided out.  Acetabulum was brought sufficiently medial and inferior brought up to good bleeding bone, sized for a 58 mm component.  Hammered in place at 45 degrees of abduction, 20 degrees of anteversion.  Good capturing and fixation augmented with 2 screws placed through the cup.  A 40 mm internal diameter polyethylene liner inserted.  Attention turned to the femur. Proximal and distal reaming and broaching.  Brought up to good sizing and fitting with a #9 component restoring normal anteversion.  After appropriate trials, a 40+ 0 head was attached.  This gave equal leg lengths.  Good stability in flexion/extension.  The hip was reduced. Wound irrigated.  External rotator and capsule repaired to the back of the intertrochanteric groove through drill holes.  FiberWire tied over bony bridge.  Charnley retractor removed.  Iliotibial band closed with #1 Vicryl.  Skin and subcutaneous tissue with Vicryl and staples. Margins were injected with Marcaine.  Sterile compressive dressing applied.  Returned to the supine position.  Abduction pillow placed. Anesthesia reversed.  Brought to the recovery room.  Tolerated surgery  well.  No complications.     Loreta Ave, M.D.     DFM/MEDQ  D:  06/18/2012  T:  06/19/2012  Job:  [id number]

## 2012-06-19 NOTE — Evaluation (Signed)
Physical Therapy Evaluation Patient Details Name: [name] MRN: [id number] DOB: [birth year] Today's Date: 06/19/2012 Time: 0830-0920 PT Time Calculation (min): 50 min  PT Assessment / Plan / Recommendation Clinical Impression  Pt is a 78 y/o male s/p L THA.  Pt is appreshensive and anxious about moving but presents with "good" mobility once moving.  Acute PT to follow pt to progress mobility.      PT Assessment  Patient needs continued PT services    Follow Up Recommendations  Home health PT;Supervision - Intermittent    Does the patient have the potential to tolerate intense rehabilitation      Barriers to Discharge None      Equipment Recommendations  None recommended by PT    Recommendations for Other Services OT consult   Frequency 7X/week    Precautions / Restrictions Precautions Precautions: Posterior Hip Precaution Booklet Issued: Yes (comment) Precaution Comments: Educated pt on posterior hip precautions.  Restrictions Weight Bearing Restrictions: Yes LLE Weight Bearing: Weight bearing as tolerated   Pertinent Vitals/Pain Pt reporting 6/10 pain in L Hip. Pt premedicated.  Pt resting comfortably after session.        Mobility  Bed Mobility Bed Mobility: Supine to Sit;Sitting - Scoot to Edge of Bed Supine to Sit: 4: Min assist;HOB flat Sitting - Scoot to [place] of Bed: 4: Min assist Details for Bed Mobility Assistance: Cues for technique and assist for L LE.   Transfers Transfers: Sit to Stand;Stand to [employer] Transfers Sit to Stand: 4: Min assist;From bed;With upper extremity assist;From elevated surface Stand to Sit: 4: Min assist;With upper extremity assist;To elevated surface;To chair/3-in-1 Stand Pivot Transfers: 4: Min assist Details for Transfer Assistance: Step by step cueing for technique.  Assist to steady pt to stand.  Cues for pt to bear weight on L LE in standing.  Step by step cueing for stand pivot transfer.    Ambulation/Gait Ambulation/Gait Assistance: Not tested (comment) (Pt feeling dizzy. ) Stairs: No Wheelchair Mobility Wheelchair Mobility: No    Shoulder Instructions     Exercises Total Joint Exercises Ankle Circles/Pumps: AROM;Both;10 reps;Seated Heel Slides: 5 reps;Left;Supine;AAROM   PT Diagnosis: Difficulty walking;Generalized weakness;Acute pain  PT Problem List: Decreased strength;Decreased range of motion;Decreased activity tolerance;Decreased mobility;Decreased knowledge of use of DME;Decreased safety awareness;Decreased knowledge of precautions;Pain PT Treatment Interventions: DME instruction;Gait training;Stair training;Functional mobility training;Therapeutic activities;Therapeutic exercise;Patient/family education   PT Goals Acute Rehab PT Goals PT Goal Formulation: With patient Time For Goal Achievement: 06/26/12 Potential to Achieve Goals: Good Pt will go Supine/Side to Sit: with supervision PT Goal: Supine/Side to Sit - Progress: Goal set today Pt will go Sit to Supine/Side: with supervision PT Goal: Sit to Supine/Side - Progress: Goal set today Pt will Transfer Bed to Chair/Chair to Bed: with supervision PT Transfer Goal: Bed to Chair/Chair to Bed - Progress: Goal set today Pt will Ambulate: 51 - 150 feet;with supervision;with rolling walker PT Goal: Ambulate - Progress: Goal set today Pt will Go Up / Down Stairs: 3-5 stairs;with supervision;with least restrictive assistive device PT Goal: Up/Down Stairs - Progress: Goal set today Pt will Perform Home Exercise Program: with supervision, verbal cues required/provided PT Goal: Perform Home Exercise Program - Progress: Goal set today  Visit Information  Last PT Received On: 06/19/12 Assistance Needed: +1    Subjective Data  Subjective: agree to PT eval Patient Stated Goal: Walk without pain   Prior Functioning  Home Living Lives With: Spouse Available Help at Discharge: Family;Available 24 hours/day Type  of Home: House Home Access: Stairs to enter [employer] of Steps: 3 Entrance Stairs-Rails: Right;Left Home Layout: One level Bathroom Accessibility: Yes How Accessible: Accessible via walker Home Adaptive Equipment: Walker - rolling;Raised toilet seat with rails Prior Function Level of Independence: Independent with assistive device(s) Able to Take Stairs?: Yes Vocation: Retired Musician: No difficulties Dominant Hand: Right    Cognition  Overall Cognitive Status: Appears within functional limits for tasks assessed/performed Arousal/Alertness: Awake/alert Orientation Level: Appears intact for tasks assessed Behavior During Session: [facility] for tasks performed    Extremity/Trunk Assessment Right Lower Extremity Assessment RLE ROM/Strength/Tone: Within functional levels Left Lower Extremity Assessment LLE ROM/Strength/Tone: Unable to fully assess;Due to precautions;Due to pain Trunk Assessment Trunk Assessment: Normal (History of back surgery. )   Balance Balance Balance Assessed: Yes Static Sitting Balance Static Sitting - Balance Support: Feet supported Static Sitting - Level of Assistance: 7: Independent  End of Session PT - End of Session Equipment Utilized During Treatment: Gait belt Activity Tolerance: Patient limited by fatigue;Other (comment) (pt limited by dizziness. ) Patient left: in chair;with call bell/phone within reach Nurse Communication: Mobility status;Precautions;Weight bearing status  GP     [name] 06/19/2012, 12:27 PM  [name] L. Dwayn Moravek DPT [phone]

## 2012-06-19 NOTE — Progress Notes (Signed)
Subjective: Doing well.  Pain controlled. Objective: Vital signs in last 24 hours: Temp:  [97.3 F (36.3 C)-99.2 F (37.3 C)] 99.2 F (37.3 C) (10/17 0635) Pulse Rate:  [47-96] 96  (10/17 0635) Resp:  [14-20] 16  (10/17 0800) BP: (128-182)/(45-88) 139/88 mmHg (10/17 0635) SpO2:  [92 %-99 %] 94 % (10/17 0635) Weight:  [90 kg (198 lb 6.6 oz)] 90 kg (198 lb 6.6 oz) (10/16 1500)  Intake/Output from previous day: 10/16 0701 - 10/17 0700 In: 2854.2 [P.O.:600; I.V.:2154.2; IV Piggyback:100] Out: 1895 [Urine:1820; Blood:75] Intake/Output this shift: Total I/O In: 240 [P.O.:240] Out: -    Basename 06/19/12 0555  HGB 12.5*    Basename 06/19/12 0555  WBC 12.9*  RBC 4.11*  HCT 36.3*  PLT 158    Basename 06/19/12 0555  NA 137  K 4.5  CL 98  CO2 28  BUN 15  CREATININE 0.59  GLUCOSE 153*  CALCIUM 9.1    Basename 06/19/12 0555  LABPT --  INR 1.06   Exam:  Dressing c/d/i.  Calf nt, nvi.    Assessment/Plan: Start PT.  Anticipate d/c home Friday or Saturday.  D/c foley and dilaudid.     [name] M 06/19/2012, 10:04 AM

## 2012-06-19 NOTE — Progress Notes (Deleted)
CARE MANAGEMENT NOTE 06/19/2012  Patient:  [name]   Account Number:  [id number]  Date Initiated:  06/19/2012  Documentation initiated by:  Kenyatta Gloeckner  Subjective/Objective Assessment:   78 yr old  male s/p right total knee arthroplasty.     Action/Plan:   Progression of care and discharge planning.CM spoke with patient regarding home health and DME needs at discharge.Choice offered.Rolling walker, 3in1 and CPM have been delivered to her home. Patient has family support at discharge.   Anticipated DC Date:  06/20/2012   Anticipated DC Plan:  [name]      DC Planning Services  CM consult      PAC Choice  HOME HEALTH   Choice offered to / List presented to:  C-1 Patient        HH arranged  HH-1 RN  HH-2 PT      HH agency  [facility]   Status of service:  Completed, signed off Medicare Important Message given?   (If response is "NO", the following Medicare IM given date fields will be blank) Date Medicare IM given:   Date Additional Medicare IM given:    Discharge Disposition:  [name]  Per UR Regulation:    If discussed at Long Length of Stay Meetings, dates discussed:    Comments:  06/19/12 11:00 AM- Yakima Kreitzer, RN BSN Case Manager Faxed orders and demographics to [facility].[phone], phone [phone].    

## 2012-06-20 LAB — CBC
Hemoglobin: 11.5 g/dL — ABNORMAL LOW (ref 13.0–17.0)
MCH: 30.7 pg (ref 26.0–34.0)
MCV: 88.2 fL (ref 78.0–100.0)
Platelets: 130 10*3/uL — ABNORMAL LOW (ref 150–400)
RBC: 3.74 MIL/uL — ABNORMAL LOW (ref 4.22–5.81)

## 2012-06-20 LAB — BASIC METABOLIC PANEL
CO2: 29 mEq/L (ref 19–32)
Calcium: 8.8 mg/dL (ref 8.4–10.5)
Creatinine, Ser: 0.66 mg/dL (ref 0.50–1.35)
Glucose, Bld: 172 mg/dL — ABNORMAL HIGH (ref 70–99)

## 2012-06-20 LAB — GLUCOSE, CAPILLARY: Glucose-Capillary: 110 mg/dL — ABNORMAL HIGH (ref 70–99)

## 2012-06-20 MED ORDER — ENOXAPARIN SODIUM 40 MG/0.4ML ~~LOC~~ SOLN: 40.0000 mg | SUBCUTANEOUS | Status: DC

## 2012-06-20 MED ORDER — WARFARIN SODIUM 7.5 MG PO TABS
7.5000 mg | ORAL_TABLET | Freq: Once | ORAL | Status: DC
  Filled 2012-06-20: qty 1

## 2012-06-20 MED ORDER — METHOCARBAMOL 500 MG PO TABS: 500.0000 mg | ORAL_TABLET | Freq: Four times a day (QID) | ORAL | Status: DC | PRN

## 2012-06-20 MED ORDER — OXYCODONE HCL 5 MG PO TABS: 5.0000 mg | ORAL_TABLET | ORAL | Status: DC | PRN

## 2012-06-20 MED ORDER — WARFARIN SODIUM 5 MG PO TABS: 5.0000 mg | ORAL_TABLET | Freq: Every day | ORAL | Status: DC

## 2012-06-20 NOTE — Progress Notes (Signed)
Physical Therapy Treatment Patient Details Name: [name] MRN: [id number] DOB: 1932/10/16 Today's Date: 06/20/2012 Time: 4098-1191 PT Time Calculation (min): 29 min  PT Assessment / Plan / Recommendation Comments on Treatment Session  Pt not progressing with mobility as anticipated.  Discussed D/c plan with pt.  Pt reports  wife unable to assist him with anything at home.  Pt may need to consider short term SNF placement if mobility does not improve.  Pt also presents with L hip IR in sitting, supine and with transfers. Pt at r    Follow Up Recommendations  Post acute inpatient;Supervision/Assistance - 24 hour     Does the patient have the potential to tolerate intense rehabilitation  No, Recommend SNF  Barriers to Discharge        Equipment Recommendations  None recommended by OT    Recommendations for Other Services OT consult  Frequency 7X/week   Plan Frequency remains appropriate;Discharge plan needs to be updated    Precautions / Restrictions Precautions Precautions: Posterior Hip Precaution Booklet Issued: Yes (comment) Precaution Comments: Educated pt on posterior hip precautions.  Restrictions Weight Bearing Restrictions: Yes LLE Weight Bearing: Weight bearing as tolerated   Pertinent Vitals/Pain Pt reporting 6/10 pain in hip with activity.      Mobility  Bed Mobility Bed Mobility: Not assessed Supine to Sit: 5: Supervision;HOB elevated Sitting - Scoot to Edge of Bed: 5: Supervision Sit to Supine: Not Tested (comment) Details for Bed Mobility Assistance: Pt required verbal and tactile cues for L hip management to maintain hip precautions.  Pt IR L hip when attempting to roll to R side or slide to pt's right.  Transfers Transfers: Sit to Stand;Stand to Sit Sit to Stand: 4: Min assist;With upper extremity assist;From bed Stand to Sit: 4: Min assist;With upper extremity assist;To chair/3-in-1 Stand Pivot Transfers: Not tested (comment) Details for Transfer  Assistance: Unable to complete full transfer due to pain. Pt. able to scoot to edge of chair with supervision and then repositioned back into chair.  Ambulation/Gait Ambulation/Gait Assistance: 4: Min guard Ambulation Distance (Feet): 10 Feet Assistive device: Rolling walker Ambulation/Gait Assistance Details: Repeated verbal cues to decrease distance from walker and decrease dependence on UEs.   Pt leaning forward relying heavily on shoulders to ambulate.  Pt fatigues quickly and refused to continue after 10 feet.   Gait Pattern: Step-to pattern;Decreased stance time - left;Decreased stride length;Decreased weight shift to left;Trunk flexed;Narrow base of support (L foot IR.   ) Gait velocity: slow Stairs: No Wheelchair Mobility Wheelchair Mobility: No    Exercises     PT Diagnosis:    PT Problem List:   PT Treatment Interventions:     PT Goals Acute Rehab PT Goals PT Goal Formulation: With patient Time For Goal Achievement: 06/26/12 Potential to Achieve Goals: Good Pt will go Supine/Side to Sit: with supervision PT Goal: Supine/Side to Sit - Progress: Progressing toward goal Pt will go Sit to Supine/Side: with supervision Pt will Transfer Bed to Chair/Chair to Bed: with supervision PT Transfer Goal: Bed to Chair/Chair to Bed - Progress: Progressing toward goal Pt will Ambulate: 51 - 150 feet;with supervision;with rolling walker PT Goal: Ambulate - Progress: Progressing toward goal Pt will Go Up / Down Stairs: 3-5 stairs;with supervision;with least restrictive assistive device Pt will Perform Home Exercise Program: with supervision, verbal cues required/provided  Visit Information  Last PT Received On: 06/20/12 Assistance Needed: +1    Subjective Data  Subjective: I feel fine not dizzy, no pain  in the hip unless I move.   Patient Stated Goal: Walk without pain   Cognition  Overall Cognitive Status: Appears within functional limits for tasks  assessed/performed Arousal/Alertness: Awake/alert Orientation Level: Appears intact for tasks assessed Behavior During Session: [facility] for tasks performed    Balance  Balance Balance Assessed: Yes Static Sitting Balance Static Sitting - Balance Support: Feet supported Static Sitting - Level of Assistance: 7: Independent  End of Session PT - End of Session Equipment Utilized During Treatment: Gait belt Activity Tolerance: Patient limited by fatigue Patient left: in chair;with call bell/phone within reach Nurse Communication: Mobility status;Precautions;Weight bearing status   GP     Analeese Andreatta 06/20/2012, 12:19 PM Bradyn Soward L. Kenise Barraco DPT [phone]

## 2012-06-20 NOTE — Clinical Social Work Psychosocial (Addendum)
    Clinical Social Work Department BRIEF PSYCHOSOCIAL ASSESSMENT 06/20/2012  Patient:  [name], [name]     Account Number:  [id number]     Admit date:  06/18/2012  Clinical Social Worker:  Tiburcio Pea  Date/Time:  06/20/2012 10:00 AM  Referred by:  Care Management  Date Referred:  06/20/2012 Referred for  SNF Placement   Other Referral:   Interview type:  Patient Other interview type:   Patient and his wife    PSYCHOSOCIAL DATA Living Status:  WIFE Admitted from facility:   Level of care:   Primary support name:   Primary support relationship to patient:  SPOUSE Degree of support available:   Good Support    CURRENT CONCERNS Current Concerns  Post-Acute Placement   Other Concerns:    SOCIAL WORK ASSESSMENT / PLAN Patient was scheduled for d/c home today with d/c order in place.  CSW notified by Physical Therapist of recommendation for SNF. Patient and his wife agreed to same; wife has recently been sick with pneumonia and does not feel she could manage patient at home.  Discussed bed search process and also that patient has [facility]. Wife/pt requested placement in [facility] if possible and bed search initiated.   Assessment/plan status:  Psychosocial Support/Ongoing Assessment of Needs Other assessment/ plan:   Information/referral to community resources:   SNF bed list provided  Patient/wife given information regarding after care needs- HH etc to be arranged by SNF    PATIENT'S/FAMILY'S RESPONSE TO PLAN OF CARE: Patient is alert, oriented and very pleasant. He does not feel that he or his wife can manage his care at home and he is requesting short term SNF. He has a very positive attittude regarding placement and was appreciate of CSW's intervention as was his wife.

## 2012-06-20 NOTE — Evaluation (Signed)
I agree with the following treatment note after reviewing documentation.   Johnston, Deklyn Gibbon Brynn   OTR/L Pager: [phone] Office: [phone] .   

## 2012-06-20 NOTE — Discharge Summary (Signed)
Physician Discharge Summary  Patient ID: [name] MRN: [id number] DOB/AGE: Mar 03, 1933 78 y.o.  Admit date: 06/18/2012 Discharge date: 06/20/2012  Admission Diagnoses:  Left hip djd, right knee djd  Discharge Diagnoses:  S/p left total hip replacement and right knee intra-articular marcaine/depromedrol injection  Discharged Condition: stable  Hospital Course:  [name] was taken to OR 18 June 2012 for left total hip replacement and right knee injection.  Tolerated procedure well without complication.  Transferred to ortho unit and dvt prophylaxis started.  17 oct, doing well.  Pain controlled.  Had some dizziness with therapy.  18 oct, feeling better but slow progress with therapy.  Hip wound looks good, staples intact.  No signs of infection.  Needs snf placement for rehab.    Consults: None  Discharge Exam: Blood pressure 130/51, pulse 98, temperature 98.2 F (36.8 C), temperature source Oral, resp. rate 14, height 5' 10.08" (1.78 m), weight 90 kg (198 lb 6.6 oz), SpO2 98.00%.   Disposition:  SNF  Discharge Orders    Future Orders Please Complete By Expires   Diet - low sodium heart healthy      Call MD / Call 911      Comments:   If you experience chest pain or shortness of breath, CALL 911 and be transported to the hospital emergency room.  If you develope a fever above 101 F, pus (white drainage) or increased drainage or redness at the wound, or calf pain, call your surgeon's office.   Constipation Prevention      Comments:   Drink plenty of fluids.  Prune juice may be helpful.  You may use a stool softener, such as Colace (over the counter) 100 mg twice a day.  Use MiraLax (over the counter) for constipation as needed.   Increase activity slowly as tolerated      Discharge instructions      Comments:   Ok to shower, but no tub soaking.  Do not apply any creams or ointments to incision.  Continue physical therapy protocol.   Driving restrictions      Comments:   No driving until further notice.   Lifting restrictions      Comments:   No lifting until further notice.   Follow the hip precautions as taught in Physical Therapy      Change dressing      Comments:   You may change your dressing daily with sterile 4 x 4 inch gauze dressing and paper tape.   TED hose      Comments:   Use stockings (TED hose) for 3-4 weeks on both leg(s).  You may remove them at night for sleeping.       Medication List     As of 06/20/2012  1:06 PM    STOP taking these medications         aspirin EC 81 MG tablet      meloxicam 7.5 MG tablet   Commonly known as: MOBIC      TAKE these medications         amLODipine 10 MG tablet   Commonly known as: NORVASC   Take 10 mg by mouth daily.      atorvastatin 80 MG tablet   Commonly known as: LIPITOR   Take 80 mg by mouth every evening.      carvedilol 25 MG tablet   Commonly known as: COREG   Take 12.5 mg by mouth 2 (two) times daily with a meal.  enoxaparin 40 MG/0.4ML injection   Commonly known as: LOVENOX   Inject 0.4 mLs (40 mg total) into the skin daily.      ferrous sulfate 325 (65 FE) MG tablet   Take 325 mg by mouth every evening.      furosemide 40 MG tablet   Commonly known as: LASIX   Take 40 mg by mouth daily.      metFORMIN 500 MG tablet   Commonly known as: GLUCOPHAGE   Take 500 mg by mouth 2 (two) times daily with a meal.      methocarbamol 500 MG tablet   Commonly known as: ROBAXIN   Take 1 tablet (500 mg total) by mouth every 6 (six) hours as needed.      oxyCODONE 5 MG immediate release tablet   Commonly known as: Oxy IR/ROXICODONE   Take 1 tablet (5 mg total) by mouth every 4 (four) hours as needed.      vitamin C 500 MG tablet   Commonly known as: ASCORBIC ACID   Take 1,000 mg by mouth daily.      warfarin 5 MG tablet   Commonly known as: COUMADIN   Take 1 tablet (5 mg total) by mouth daily.           Follow-up Information    Schedule an appointment as soon  as possible for a visit with Brandon Ave, MD. (need return office visit 2 weeks postop)    Contact information:   [address] [place] [address] [phone]          Signed: Naida Sleight 06/20/2012, 1:06 PM

## 2012-06-20 NOTE — Evaluation (Signed)
Occupational Therapy Evaluation Patient Details Name: [name] MRN: [id number] DOB: 1933-07-08 Today's Date: 06/20/2012 Time: 1324-4010 OT Time Calculation (min): 19 min  OT Assessment / Plan / Recommendation Clinical Impression  Pt. 78 yo male s/p left THA with posterior precautions. Pt. unable to recall precautions, re-educated and only able to recall 1/3. Pt. very limited this session due to pain level 9/10. Only able to scoot to edge of chair and then repositioned back into chair due to refusal to walk to bathroom. Educated on AE for LB bathing and dressing. Pt. would benefit from OT acutely to address independence with ADL's.    OT Assessment  Patient needs continued OT Services    Follow Up Recommendations  Skilled nursing facility    Barriers to Discharge      Equipment Recommendations  None recommended by OT    Recommendations for Other Services    Frequency  Min 2X/week    Precautions / Restrictions Precautions Precautions: Posterior Hip Precaution Booklet Issued: Yes (comment) Precaution Comments: Educated pt on posterior hip precautions.  Restrictions Weight Bearing Restrictions: Yes LLE Weight Bearing: Weight bearing as tolerated   Pertinent Vitals/Pain Pt. Reports pain 9/10, called RN and notified pt request pain meds.     ADL  Eating/Feeding: Performed;Set up Where Assessed - Eating/Feeding: Chair Transfers/Ambulation Related to ADLs: Unable to fully assess, session limited by pain ADL Comments: Pt. unable to recall hip precautions, re-educated and asked at end of session only able to recall 1/3 (no bending). Attempted to walk to bathroom to wash hands prior to eating lunch. Pt. able to scoot to edge of chair with supervison and extra time but then stated he could not go any further due to pain level 9/10. Pt. repositioned in chair and educated on use of AE for LB dressing and bathing. Pt. already semi familiar with equipment from previous back surgery.  Reports his wife helps him with a lot of his LB tasks.     OT Diagnosis: Generalized weakness;Acute pain  OT Problem List: Decreased strength;Decreased activity tolerance;Decreased safety awareness;Decreased knowledge of precautions;Pain OT Treatment Interventions: Self-care/ADL training;Therapeutic exercise;DME and/or AE instruction;Therapeutic activities;Patient/family education   OT Goals Acute Rehab OT Goals OT Goal Formulation: With patient Time For Goal Achievement: 07/04/12 Potential to Achieve Goals: Good ADL Goals Pt Will Perform Grooming: with supervision;Standing at sink ADL Goal: Grooming - Progress: Goal set today Pt Will Perform Lower Body Bathing: with supervision;Sit to stand from chair;Sit to stand from bed;with adaptive equipment ADL Goal: Lower Body Bathing - Progress: Goal set today Pt Will Perform Lower Body Dressing: with supervision;Sit to stand from chair;with adaptive equipment;with caregiver independent in assisting ADL Goal: Lower Body Dressing - Progress: Goal set today Pt Will Transfer to Toilet: with supervision;Ambulation;Raised toilet seat with arms;Maintaining hip precautions ADL Goal: Toilet Transfer - Progress: Goal set today Pt Will Perform Toileting - Clothing Manipulation: with supervision;Standing ADL Goal: Toileting - Clothing Manipulation - Progress: Goal set today Pt Will Perform Toileting - Hygiene: with supervision;Sit to stand from 3-in-1/toilet ADL Goal: Toileting - Hygiene - Progress: Goal set today Pt Will Perform Tub/Shower Transfer: Shower transfer;with supervision;Ambulation;Maintaining hip precautions ADL Goal: Tub/Shower Transfer - Progress: Goal set today Miscellaneous OT Goals Miscellaneous OT Goal #1: Pt. will verbally recall 3/3 hip precautions as precursor to ADL's OT Goal: Miscellaneous Goal #1 - Progress: Goal set today  Visit Information  Last OT Received On: 06/20/12 Assistance Needed: +1    Subjective Data  Subjective:  I am about to  freeze to death in here Patient Stated Goal: None stated    Prior Functioning     Home Living Lives With: Spouse Available Help at Discharge: Family;Available 24 hours/day Type of Home: House Home Access: Stairs to enter [employer] of Steps: 3 Entrance Stairs-Rails: Right;Left Home Layout: One level Bathroom Shower/Tub: Health visitor: Handicapped height Bathroom Accessibility: Yes How Accessible: Accessible via walker Home Adaptive Equipment: Walker - rolling;Raised toilet seat with rails Prior Function Level of Independence: Independent with assistive device(s) Able to Take Stairs?: Yes Vocation: Retired Musician: No difficulties Dominant Hand: Right         Vision/Perception     Cognition  Overall Cognitive Status: Appears within functional limits for tasks assessed/performed Arousal/Alertness: Awake/alert Orientation Level: Appears intact for tasks assessed Behavior During Session: [facility] for tasks performed    Extremity/Trunk Assessment Right Upper Extremity Assessment RUE ROM/Strength/Tone: [facility] for tasks assessed Left Upper Extremity Assessment LUE ROM/Strength/Tone: WFL for tasks assessed     Mobility Bed Mobility Bed Mobility: Not assessed Transfers Transfers: Not assessed Details for Transfer Assistance: Unable to complete full transfer due to pain. Pt. able to scoot to edge of chair with supervision and then repositioned back into chair.                     End of Session OT - End of Session Equipment Utilized During Treatment: Gait belt Activity Tolerance: Patient limited by pain Patient left: in chair;with call bell/phone within reach Nurse Communication: Patient requests pain meds  GO     Cleora Fleet 06/20/2012, 12:07 PM

## 2012-06-20 NOTE — Progress Notes (Signed)
ANTICOAGULATION CONSULT NOTE - Initial Consult  Pharmacy Consult for couomadin Indication: VTE prophylaxis  Allergies  Allergen Reactions  . Erythromycin Base Itching and Rash    Patient Measurements: Height: 5' 10.08" (178 cm) Weight: 198 lb 6.6 oz (90 kg) IBW/kg (Calculated) : 73.18    Vital Signs: Temp: 98.5 F (36.9 C) (10/18 0600) BP: 118/50 mmHg (10/18 0600) Pulse Rate: 69  (10/18 0600)  Labs:  Basename 06/20/12 0545 06/19/12 0555  HGB 11.5* 12.5*  HCT 33.0* 36.3*  PLT 130* 158  APTT -- --  LABPROT 17.3* 13.7  INR 1.46 1.06  HEPARINUNFRC -- --  CREATININE 0.66 0.59  CKTOTAL -- --  CKMB -- --  TROPONINI -- --    Estimated Creatinine Clearance: 87.4 ml/min (by C-G formula based on Cr of 0.66).   Medical History: Past Medical History  Diagnosis Date  . Pneumonia 08/2011    hx of  . Anginal pain   . Myocardial infarction 02/23/2009  . Coronary artery disease   . Diabetes mellitus   . Arthritis   . Anemia   . Stroke     dysarthria, now resolved '07  . Carotid artery stenosis     RICAS 80-99%, LICAS 60-79% 05/10/11 @ [facility] - Dr. Judithe Modest (refused surgical evaluation)    Medications:  Scheduled:     . amLODipine  10 mg Oral Daily  . atorvastatin  80 mg Oral QPM  . carvedilol  12.5 mg Oral BID WC  . docusate sodium  100 mg Oral BID  . enoxaparin (LOVENOX) injection  40 mg Subcutaneous Q24H  . ferrous sulfate  325 mg Oral QPM  . furosemide  40 mg Oral Daily  . insulin aspart  0-15 Units Subcutaneous TID WC  . metFORMIN  500 mg Oral BID WC  . warfarin  7.5 mg Oral ONCE-1800  . warfarin   Does not apply Once  . Warfarin - Pharmacist Dosing Inpatient   Does not apply q1800    Assessment: 78 yr old male s/p left total hip arthroplasty, and right knee injection on coumadin for VTE prophylaxis. INR 1.46 trending up nicely. Pt. Is also on lovenox 40mg  sq q 24 hrs, renal function stable. Hgb/Plts stable, no bleeding per chart.   Goal of Therapy:    INR 2-3  Plan:  1) Coumadin 7.5 mg tonight. 2) Daily PT/INR  Bayard Hugger, PharmD, BCPS  Clinical Pharmacist  Pager: [phone]  06/20/2012,9:03 AM

## 2012-06-20 NOTE — Clinical Social Work Placement (Signed)
     Clinical Social Work Department CLINICAL SOCIAL WORK PLACEMENT NOTE 06/20/2012  Patient:  [name], [name]  Account Number:  [id number] Admit date:  06/18/2012  Clinical Social Worker:  Lupita Leash Johnpaul Gillentine, LCSWA  Date/time:  06/20/2012 04:58 PM  Clinical Social Work is seeking post-discharge placement for this patient at the following level of care:   SKILLED NURSING   (*CSW will update this form in Epic as items are completed)   06/20/2012  Patient/family provided with [facility] Department of Clinical Social Works list of facilities offering this level of care within the geographic area requested by the patient (or if unable, by the patients family).  06/20/2012  Patient/family informed of their freedom to choose among providers that offer the needed level of care, that participate in Medicare, Medicaid or managed care program needed by the patient, have an available bed and are willing to accept the patient.  06/20/2012  Patient/family informed of [facility] ownership interest in [facility], as well as of the fact that they are under no obligation to receive care at this facility.  PASARR submitted to [employer]DS on 06/20/2012 PASARR number received from [employer] on 06/20/2012  FL2 transmitted to all facilities in geographic area requested by pt/family on  06/20/2012 FL2 transmitted to all facilities within larger geographic area on   Patient informed that his/her managed care company has contracts with or will negotiate with  certain facilities, including the following:   [facility] Medicare. Referral sent to [facility] per request of patient/wife     Patient/family informed of bed offers received:  06/20/2012 Patient chooses bed at [facility] Physician recommends and patient chooses bed at    Patient to be transferred to [facility] on  06/20/2012 Patient to be transferred to facility by ambulance [facility])  The following physician request were  entered in Epic:   Additional Comments: Patient and wife were pleased with d/c plan.  Notified SNF and pt's nurse Rella Larve of d/c.  Also notified Zonia Kief, PA of d/c to SNF.  Lorri Frederick. Cowder, LCSWA

## 2012-06-20 NOTE — Progress Notes (Signed)
Physical Therapy Treatment Patient Details Name: [name] MRN: [id number] DOB: 01/05/1933 Today's Date: 06/20/2012 Time: 4098-1191 PT Time Calculation (min): 31 min  PT Assessment / Plan / Recommendation Comments on Treatment Session  Spoke again with pt about d/c status.  Pt does not feel safe about going home.  Based on pt's performance in PT short term SNF would be most appropriate setting for continued PT. Pt continues to present with L hip IR placing pt at significant risk to dislocate hip.      Follow Up Recommendations  Post acute inpatient;Supervision/Assistance - 24 hour     Does the patient have the potential to tolerate intense rehabilitation  No, Recommend SNF  Barriers to Discharge        Equipment Recommendations  None recommended by OT    Recommendations for Other Services OT consult  Frequency 7X/week   Plan Frequency remains appropriate;Discharge plan needs to be updated    Precautions / Restrictions Precautions Precautions: Posterior Hip Precaution Booklet Issued: Yes (comment) Precaution Comments: Educated pt on posterior hip precautions.  Restrictions Weight Bearing Restrictions: Yes LLE Weight Bearing: Weight bearing as tolerated   Pertinent Vitals/Pain Pt reporting no pain in hip at rest.      Mobility  Bed Mobility Bed Mobility: Not assessed Transfers Transfers: Sit to Stand;Stand to Sit Sit to Stand: 4: Min guard;From chair/3-in-1;With upper extremity assist Stand to Sit: 4: Min guard;To chair/3-in-1;With upper extremity assist Stand Pivot Transfers: Not tested (comment) Details for Transfer Assistance: Pt continues to present with excessive L hip IR when attempting to stand/sit.  Manual facilitation to block L hip in neutral postition to maintain hip precautions.  Ambulation/Gait Ambulation/Gait Assistance: 4: Min guard Ambulation Distance (Feet): 10 Feet Assistive device: Rolling walker Ambulation/Gait Assistance Details: Pt continues  to fatigue quickly and heavily rely on UEs.   Gait Pattern: Step-to pattern;Decreased stance time - left;Decreased stride length;Decreased weight shift to left;Trunk flexed;Narrow base of support Gait velocity: slow General Gait Details: Pt avoids wb on L LE.   Stairs: Yes Stairs Assistance: 3: Mod assist Stairs Assistance Details (indicate cue type and reason): Pt required assistance to manage and steady walker on steps.  Pt provided with step by step instruction on sequencing and required max encouragement to advance RLE.   Stair Management Technique: One rail Right;Two rails;With walker;Step to pattern;Backwards;Forwards Number of Stairs: 4  (2 steps forward with rails 2 steps backward with walker.) Wheelchair Mobility Wheelchair Mobility: Yes    Exercises Total Joint Exercises Ankle Circles/Pumps: AROM;Both;10 reps;Seated Quad Sets: 10 reps;Supine;Left Gluteal Sets: Both;10 reps;Supine Heel Slides: 5 reps;Left;Supine;AAROM   PT Diagnosis:    PT Problem List:   PT Treatment Interventions:     PT Goals Acute Rehab PT Goals PT Goal Formulation: With patient Time For Goal Achievement: 06/26/12 Potential to Achieve Goals: Good Pt will go Supine/Side to Sit: with supervision Pt will go Sit to Supine/Side: with supervision Pt will Transfer Bed to Chair/Chair to Bed: with supervision PT Transfer Goal: Bed to Chair/Chair to Bed - Progress: Progressing toward goal Pt will Ambulate: 51 - 150 feet;with supervision;with rolling walker PT Goal: Ambulate - Progress: Progressing toward goal Pt will Go Up / Down Stairs: 3-5 stairs;with supervision;with least restrictive assistive device PT Goal: Up/Down Stairs - Progress: Progressing toward goal Pt will Perform Home Exercise Program: with supervision, verbal cues required/provided PT Goal: Perform Home Exercise Program - Progress: Progressing toward goal  Visit Information  Last PT Received On: 06/20/12 Assistance Needed: +1  Subjective Data      Cognition  Overall Cognitive Status: Appears within functional limits for tasks assessed/performed Arousal/Alertness: Awake/alert Orientation Level: Appears intact for tasks assessed Behavior During Session: [facility] for tasks performed    Balance  Balance Balance Assessed: No  End of Session PT - End of Session Equipment Utilized During Treatment: Gait belt Activity Tolerance: Patient limited by fatigue;Patient limited by pain Patient left: in chair;with call bell/phone within reach Nurse Communication: Mobility status;Precautions;Weight bearing status   GP     Arnisha Laffoon 06/20/2012, 1:34 PM Shaguana Love L. Jakorian Marengo DPT [phone]

## 2012-06-20 NOTE — Progress Notes (Signed)
Subjective: Pain controlled.  Slow progress with therapy.  Needs short snf placement.     Objective: Vital signs in last 24 hours: Temp:  [98.2 F (36.8 C)-98.9 F (37.2 C)] 98.2 F (36.8 C) (10/18 0900) Pulse Rate:  [67-98] 98  (10/18 0900) Resp:  [14-18] 14  (10/18 0900) BP: (118-141)/(50-61) 130/51 mmHg (10/18 0900) SpO2:  [90 %-98 %] 98 % (10/18 0900)  Intake/Output from previous day: 10/17 0701 - 10/18 0700 In: 1012 [P.O.:600; I.V.:412] Out: 950 [Urine:950] Intake/Output this shift: Total I/O In: 320 [P.O.:240; I.V.:80] Out: -    Basename 06/20/12 0545 06/19/12 0555  HGB 11.5* 12.5*    Basename 06/20/12 0545 06/19/12 0555  WBC 12.6* 12.9*  RBC 3.74* 4.11*  HCT 33.0* 36.3*  PLT 130* 158    Basename 06/20/12 0545 06/19/12 0555  NA 132* 137  K 4.2 4.5  CL 96 98  CO2 29 28  BUN 14 15  CREATININE 0.66 0.59  GLUCOSE 172* 153*  CALCIUM 8.8 9.1    Basename 06/20/12 0545 06/19/12 0555  LABPT -- --  INR 1.46 1.06    Exam:  Wound looks good.  Staples intact.  No drainage or signs of infection.  Calf nt, nvi.    Assessment/Plan: Arrange snf placement for today or Saturday.     Nazli Penn M 06/20/2012, 1:00 PM

## 2013-01-09 ENCOUNTER — Other Ambulatory Visit: Payer: Self-pay | Admitting: Neurosurgery

## 2013-01-09 DIAGNOSIS — M48061 Spinal stenosis, lumbar region without neurogenic claudication: Secondary | ICD-10-CM

## 2013-01-13 ENCOUNTER — Ambulatory Visit
Admission: RE | Admit: 2013-01-13 | Discharge: 2013-01-13 | Disposition: A | Discharge status: Home / Self Care | Payer: Medicare PPO | Source: Ambulatory Visit | Attending: Neurosurgery | Admitting: Neurosurgery

## 2013-01-13 DIAGNOSIS — M48061 Spinal stenosis, lumbar region without neurogenic claudication: Secondary | ICD-10-CM

## 2013-01-13 MED ORDER — GADOBENATE DIMEGLUMINE 529 MG/ML IV SOLN
18.0000 mL | Freq: Once | INTRAVENOUS | Status: AC | PRN
  Administered 2013-01-13: 18 mL via INTRAVENOUS

## 2014-12-31 ENCOUNTER — Other Ambulatory Visit: Payer: Self-pay | Admitting: Neurosurgery

## 2014-12-31 DIAGNOSIS — M47816 Spondylosis without myelopathy or radiculopathy, lumbar region: Secondary | ICD-10-CM

## 2015-01-02 HISTORY — PX: BACK SURGERY: SHX140

## 2015-01-04 ENCOUNTER — Ambulatory Visit
Admission: RE | Admit: 2015-01-04 | Discharge: 2015-01-04 | Disposition: A | Discharge status: Home / Self Care | Payer: Medicare HMO | Source: Ambulatory Visit | Attending: Neurosurgery | Admitting: Neurosurgery

## 2015-01-04 DIAGNOSIS — M47816 Spondylosis without myelopathy or radiculopathy, lumbar region: Secondary | ICD-10-CM

## 2015-09-20 ENCOUNTER — Other Ambulatory Visit: Payer: Self-pay | Admitting: Physician Assistant

## 2015-09-20 NOTE — H&P (Signed)
TOTAL KNEE ADMISSION H&P  Patient is being admitted for right total knee arthroplasty.  Subjective:  Chief Complaint:right knee pain.  HPI: [name], 82 y.o. male, has a history of pain and functional disability in the right knee due to arthritis and has failed non-surgical conservative treatments for greater than 12 weeks to includeNSAID's and/or analgesics, corticosteriod injections, viscosupplementation injections and activity modification.  Onset of symptoms was gradual, starting >10 years ago with gradually worsening course since that time. The patient noted no past surgery on the bilaterally knee(s).  Patient currently rates pain in the right knee(s) at 5 out of 10 with activity. Patient has night pain, worsening of pain with activity and weight bearing, pain with passive range of motion, crepitus and joint swelling.  Patient has evidence of subchondral sclerosis and joint space narrowing by imaging studies. There is no active infection.  There are no active problems to display for this patient.  Past Medical History  Diagnosis Date  . Pneumonia 08/2011    hx of  . Anginal pain   . Myocardial infarction (HCC) 02/23/2009  . Coronary artery disease   . Diabetes mellitus   . Arthritis   . Anemia   . Stroke     dysarthria, now resolved '07  . Carotid artery stenosis     RICAS 80-99%, LICAS 60-79% 05/10/11 @ [facility] - Dr. Judithe Modest (refused surgical evaluation)    Past Surgical History  Procedure Laterality Date  . Appendectomy    . Hand surgery      correction of webbed fingers bilat  . Rotator cuff repair      left  . Carpal tunnel release      L  . Joint replacement      L shoulder  . Coronary artery bypass graft  2010    triple bypass  . Back surgery  [id number], 2011, 03/2011    lumbar and cervical  . Total hip arthroplasty  06/18/2012    left  . Total hip arthroplasty  06/18/2012    Procedure: TOTAL HIP ARTHROPLASTY;  Surgeon: Loreta Ave, MD;  Location: [facility]  OR;  Service: Orthopedics;  Laterality: Left;  left total hip arthroplasty with right knee injection  . Injection knee  06/18/2012    Procedure: KNEE INJECTION;  Surgeon: Loreta Ave, MD;  Location: [facility] OR;  Service: Orthopedics;  Laterality: Right;     (Not in a hospital admission) Allergies  Allergen Reactions  . Erythromycin Base Itching and Rash    Social History  Substance Use Topics  . Smoking status: Former Smoker    Quit date: 06/18/1979  . Smokeless tobacco: Never Used  . Alcohol Use: No    No family history on file.   Review of Systems  Constitutional: Negative.   HENT: Negative.   Eyes: Negative.   Respiratory: Negative.   Cardiovascular: Negative.   Gastrointestinal: Negative.   Genitourinary: Negative.   Musculoskeletal: Positive for back pain and joint pain.  Skin: Negative.   Neurological: Negative.   Endo/Heme/Allergies: Bruises/bleeds easily.  Psychiatric/Behavioral: Negative.     Objective:  Physical Exam  Constitutional: He is oriented to person, place, and time. He appears well-developed and well-nourished.  HENT:  Head: Normocephalic and atraumatic.  Eyes: EOM are normal. Pupils are equal, round, and reactive to light.  Neck: Normal range of motion. Neck supple.  Cardiovascular: Normal rate and regular rhythm.   Respiratory: Effort normal and breath sounds normal.  GI: Soft. Bowel sounds are normal.  Musculoskeletal:  Markedly antalgic gait on the right.  Negative log roll left hip.  A little pain to log roll on the right, but he still has fairly good motion.  Negative straight leg raise, both sides.  Left knee has 3+ patellofemoral crepitus, but he still has full extension, better than 100 degrees of flexion.  Reasonable alignment and stability.  Right knee 1-2+ effusion.  He can barely straighten the knee out because of patellofemoral crepitus and pain.  Ligaments are stable.  He is tender medial and lateral joint lines with popping with  McMurray's on both sides.  He is neurovascularly intact distally.      Neurological: He is alert and oriented to person, place, and time.  Skin: Skin is warm and dry.  Psychiatric: He has a normal mood and affect. His behavior is normal. Judgment and thought content normal.    Vital signs in last 24 hours: @VSRANGES@  Labs:   Estimated body mass index is 28.69 kg/(m^2) as calculated from the following:   Height as of 06/18/12: 5' 10.08" (1.78 m).   Weight as of 06/04/12: 90.9 kg (200 lb 6.4 oz).   Imaging Review Plain radiographs demonstrate severe degenerative joint disease of the right knee(s). The overall alignment ismild varus. The bone quality appears to be fair for age and reported activity level.  Assessment/Plan:  End stage arthritis, right knee   The patient history, physical examination, clinical judgment of the provider and imaging studies are consistent with end stage degenerative joint disease of the right knee(s) and total knee arthroplasty is deemed medically necessary. The treatment options including medical management, injection therapy arthroscopy and arthroplasty were discussed at length. The risks and benefits of total knee arthroplasty were presented and reviewed. The risks due to aseptic loosening, infection, stiffness, patella tracking problems, thromboembolic complications and other imponderables were discussed. The patient acknowledged the explanation, agreed to proceed with the plan and consent was signed. Patient is being admitted for inpatient treatment for surgery, pain control, PT, OT, prophylactic antibiotics, VTE prophylaxis, progressive ambulation and ADL's and discharge planning. The patient is planning to be discharged home with home health services   

## 2015-09-22 ENCOUNTER — Encounter (HOSPITAL_COMMUNITY)
Admission: RE | Admit: 2015-09-22 | Discharge: 2015-09-22 | Disposition: A | Discharge status: Home / Self Care | Payer: Medicare HMO | Source: Ambulatory Visit | Attending: Orthopedic Surgery | Admitting: Orthopedic Surgery

## 2015-09-22 ENCOUNTER — Encounter (HOSPITAL_COMMUNITY): Payer: Self-pay

## 2015-09-22 DIAGNOSIS — I69322 Dysarthria following cerebral infarction: Secondary | ICD-10-CM | POA: Insufficient documentation

## 2015-09-22 DIAGNOSIS — E119 Type 2 diabetes mellitus without complications: Secondary | ICD-10-CM | POA: Insufficient documentation

## 2015-09-22 DIAGNOSIS — L400 Psoriasis vulgaris: Secondary | ICD-10-CM | POA: Diagnosis not present

## 2015-09-22 DIAGNOSIS — Z0183 Encounter for blood typing: Secondary | ICD-10-CM | POA: Insufficient documentation

## 2015-09-22 DIAGNOSIS — Z7982 Long term (current) use of aspirin: Secondary | ICD-10-CM | POA: Insufficient documentation

## 2015-09-22 DIAGNOSIS — Z01818 Encounter for other preprocedural examination: Secondary | ICD-10-CM | POA: Insufficient documentation

## 2015-09-22 DIAGNOSIS — Z981 Arthrodesis status: Secondary | ICD-10-CM | POA: Diagnosis not present

## 2015-09-22 DIAGNOSIS — Z01812 Encounter for preprocedural laboratory examination: Secondary | ICD-10-CM | POA: Insufficient documentation

## 2015-09-22 DIAGNOSIS — I252 Old myocardial infarction: Secondary | ICD-10-CM | POA: Diagnosis not present

## 2015-09-22 DIAGNOSIS — Z96642 Presence of left artificial hip joint: Secondary | ICD-10-CM | POA: Diagnosis not present

## 2015-09-22 DIAGNOSIS — Z951 Presence of aortocoronary bypass graft: Secondary | ICD-10-CM | POA: Insufficient documentation

## 2015-09-22 DIAGNOSIS — M1711 Unilateral primary osteoarthritis, right knee: Secondary | ICD-10-CM | POA: Diagnosis not present

## 2015-09-22 DIAGNOSIS — Z79899 Other long term (current) drug therapy: Secondary | ICD-10-CM | POA: Insufficient documentation

## 2015-09-22 DIAGNOSIS — Z955 Presence of coronary angioplasty implant and graft: Secondary | ICD-10-CM | POA: Insufficient documentation

## 2015-09-22 DIAGNOSIS — Z87891 Personal history of nicotine dependence: Secondary | ICD-10-CM | POA: Diagnosis not present

## 2015-09-22 DIAGNOSIS — I2510 Atherosclerotic heart disease of native coronary artery without angina pectoris: Secondary | ICD-10-CM | POA: Insufficient documentation

## 2015-09-22 HISTORY — DX: Essential (primary) hypertension: I10

## 2015-09-22 HISTORY — DX: Adverse effect of unspecified anesthetic, initial encounter: T41.45XA

## 2015-09-22 HISTORY — DX: Other complications of anesthesia, initial encounter: T88.59XA

## 2015-09-22 LAB — CBC WITH DIFFERENTIAL/PLATELET
BASOS ABS: 0 10*3/uL (ref 0.0–0.1)
BASOS PCT: 0 %
Eosinophils Absolute: 0.2 10*3/uL (ref 0.0–0.7)
Eosinophils Relative: 4 %
HEMATOCRIT: 46 % (ref 39.0–52.0)
HEMOGLOBIN: 15.6 g/dL (ref 13.0–17.0)
LYMPHS PCT: 23 %
Lymphs Abs: 1.4 10*3/uL (ref 0.7–4.0)
MCH: 31 pg (ref 26.0–34.0)
MCHC: 33.9 g/dL (ref 30.0–36.0)
MCV: 91.5 fL (ref 78.0–100.0)
Monocytes Absolute: 0.5 10*3/uL (ref 0.1–1.0)
Monocytes Relative: 8 %
NEUTROS ABS: 3.8 10*3/uL (ref 1.7–7.7)
NEUTROS PCT: 65 %
Platelets: 132 10*3/uL — ABNORMAL LOW (ref 150–400)
RBC: 5.03 MIL/uL (ref 4.22–5.81)
RDW: 12.9 % (ref 11.5–15.5)
WBC: 5.9 10*3/uL (ref 4.0–10.5)

## 2015-09-22 LAB — TYPE AND SCREEN
ABO/RH(D): B NEG
ANTIBODY SCREEN: NEGATIVE

## 2015-09-22 LAB — PROTIME-INR
INR: 1.05 (ref 0.00–1.49)
Prothrombin Time: 13.9 seconds (ref 11.6–15.2)

## 2015-09-22 LAB — COMPREHENSIVE METABOLIC PANEL
ALBUMIN: 4.2 g/dL (ref 3.5–5.0)
ALK PHOS: 79 U/L (ref 38–126)
ALT: 23 U/L (ref 17–63)
AST: 25 U/L (ref 15–41)
Anion gap: 6 (ref 5–15)
BILIRUBIN TOTAL: 1.2 mg/dL (ref 0.3–1.2)
BUN: 9 mg/dL (ref 6–20)
CO2: 29 mmol/L (ref 22–32)
CREATININE: 0.75 mg/dL (ref 0.61–1.24)
Calcium: 9.5 mg/dL (ref 8.9–10.3)
Chloride: 103 mmol/L (ref 101–111)
GFR calc Af Amer: >60 mL/min (ref >60)
GFR calc non Af Amer: >60 mL/min (ref >60)
GLUCOSE: 129 mg/dL — AB (ref 65–99)
POTASSIUM: 4.3 mmol/L (ref 3.5–5.1)
Sodium: 138 mmol/L (ref 135–145)
TOTAL PROTEIN: 6.5 g/dL (ref 6.5–8.1)

## 2015-09-22 LAB — GLUCOSE, CAPILLARY: Glucose-Capillary: 130 mg/dL — ABNORMAL HIGH (ref 65–99)

## 2015-09-22 LAB — SURGICAL PCR SCREEN
MRSA, PCR: NEGATIVE
STAPHYLOCOCCUS AUREUS: NEGATIVE

## 2015-09-22 LAB — APTT: APTT: 28 s (ref 24–37)

## 2015-09-22 NOTE — Progress Notes (Signed)
PCP: Dr. Charlesetta Garibaldi  [facility] [place]-request clearance  Cardiologist: Dr. Tobi [facility]os  [facility] [place]request ekg/cardiac studies/clearance  Pt. States he is not diabetic. Last time he checked sugars 1 1/2 yrs. Ago. States PCP took him off oral diabetic meds. At that time.   States Dr. Eulah Pont instructions are to stop aspirin on Jan. 25.

## 2015-09-22 NOTE — Pre-Procedure Instructions (Addendum)
    [name]  09/22/2015      [id number] - [name], [place] - [name] [place] [address] Phone: [phone] Fax: [phone]    Your procedure is scheduled on Wednesday, Feb. 1  Report to [facility] Admitting at 7:50 A.M.  Call this number if you have problems the morning of surgery:  [phone]   Remember:  Do not eat food or drink liquids after midnight on Tuesday, Jan. 31   Take these medicines the morning of surgery with A SIP OF WATER: amlodipine (norvasc),carvedilol (coreg), methocarbamol (robaxin), oxycodone if needed, inhaler if needed-bring to hospital              1 Week prior to surgery stop: advil, motrin, ibuprofen, aleve,BC'S, goodys, herbal medicines and aspirin per Dr. Greig Right instructions    Do not wear jewelry, make-up or nail polish.  Do not wear lotions, powders, or perfumes.  You may not wear deodorant.  Do not shave 48 hours prior to surgery.  Men may shave face and neck.  Do not bring valuables to the hospital.  [facility] is not responsible for any belongings or valuables.  Contacts, dentures or bridgework may not be worn into surgery.  Leave your suitcase in the car.  After surgery it may be brought to your room.  For patients admitted to the hospital, discharge time will be determined by your treatment team.  Patients discharged the day of surgery will not be allowed to drive home.    Special instructions:  Review handouts  Please read over the following fact sheets that you were given. Pain Booklet, Coughing and Deep Breathing, Blood Transfusion Information, Total Joint Packet, MRSA Information and Surgical Site Infection Prevention

## 2015-09-23 LAB — URINE CULTURE: Culture: 7000

## 2015-09-23 NOTE — Progress Notes (Addendum)
Anesthesia chart review: Patient is an 82-year-old male scheduled for right TKR on 10/05/2015 by Dr. Daniel Murphy.  History includes former smoker, CAD/inferior MI 02/23/2009 s/p RCA BMS followed by CABG X 3 (LIMA to LAD, SVG to marginal CX, SVG to distal RCA @ [facility] 04/01/09), pneumonia in 04/2009 and 08/2011, arthritis, diabetes mellitus type 2 (no longer requiring medication), CVA '08 (dysarthria, "95%" improved), anemia, plaque psoriasis, cervical fusion '96, back surgery 12/2009 and L1-5 PLIF 03/2011, moderate to severe carotid artery occlusive disease (declined surgical evaluation), left THA 06/18/12.   Patient he has an Advanced Directive (copy scanned under Media tab, 03/19/11.)   PCP is Dr. Stephen Hsieh at [facility] in [place], [place].He has signed a note for medical and cardiac clearance for this procedure.    Cardiologist is Dr. Katie Twomley with [facility]. Last visit 08/15/15 for follow-up and pre-operative evaluation. Cardiology clearance note requested.    Meds include amlodipine, ASA 81 mg, Lipitor, Coreg, Lodine, 65 Fe, Lasix, Norco, Robaxin, oxycodone, Stiolto Respimat.  05/30/15 EKG ([facility], see Care Everywhere):  Sinus bradycardia with sinus arrhythmia First degree A-V block with occasional Premature ventricular complexes. Left axis deviation. Left bundle branch block. When compared with ECG of 15-Feb-2008 07:48, Premature ventricular complexes are now present, PR interval has increased, Left bundle branch block now present. Evidence for Septal infarct are no longer present. TRACING REQUESTED.  09/08/14 Nuclear stress test ([facility], see Care Everywhere): No inducible ischemia. Normal left ventricular function, EF 61%.  These following records are scanned under the Media tab, Correspondence, Encounter 06/18/12: - His last cardiac cath was on 02/23/09 showing 3V CAD and is now s/p CABG.  - 04/02/09 Echo: Normal LV systolic function, mild LVH, EF  55-65%, trace tricuspid regurgitation. - 05/10/11 Carotid U/S: Impressions: 1. Suggest critical right ICA stenosis, 80-99%. Suggest severe left ICA stenosis, 60-79%. Essentially unchanged since the previous study. (I called and spoke with Mr. Brindley. He reports that he has had known significant carotid disease before 2008. It sounds like he actually met with a vascular surgeon several years ago and after hearing about the risks of surgery declined any intervention. He is concerned about his stroke risk associated with surgery--although we did discuss that having severe carotid stenosis is a stroke risk as well. He is not interested in having surgical intervention for his carotid disease despite CVA risk. He denied any neurologic changes such as weakness, dysarthria, visual changes.)  05/30/15 CXR ([facility], see Care Everywhere): FINDINGS:  . Supportive devices: None . Cardiovascular: Prior CABG.  . Mediastinum: Within normal limits.  . Lungs/pleura: Right basilar atelectasis versus scar with elevated right hemidiaphragm.  . Upper abdomen: Lumbar fusion apparatus partially included.  . Osseous structures: Left shoulder prosthesis and lower cervical plate and screw fixation.  Preoperative labs noted. PLT 132. Glucose 129. Urine culture showed 7,000 colonies/mL, insignificant growth. A1c was 6.5 on 09/13/15 (PCP).  Chart will be left for follow-up regarding EKG tracing and cardiology clearance note. I did notify anesthesiologist Dr. Moser that patient has known severe carotid stenosis but has refused surgical intervention for several years now. Because he is at increased CVA risk, could consider spinal anesthesia so neurologic status could be monitored more closely intra-op, but would defer decision to his assigned anesthesiologist on the day of surgery.  Allison Zelenak, PA-C [facility]/Anesthesiology Phone [phone] 09/23/2015 5:57 PM  Addendum: Signed cardiac clearance note  received from Dr. Twomley. Nursing staff to follow-up on last   EKG. If one not received then patient will need an updated EKG on the day of surgery.  Allison Zelenak, PA-C [facility]iology Phone [phone] 09/26/2015 3:30 PM   

## 2015-10-04 MED ORDER — CHLORHEXIDINE GLUCONATE 4 % EX LIQD: 60.0000 mL | Freq: Once | CUTANEOUS | Status: DC

## 2015-10-04 MED ORDER — LACTATED RINGERS IV SOLN
INTRAVENOUS | Status: DC
  Administered 2015-10-05 (×2): via INTRAVENOUS

## 2015-10-04 MED ORDER — CEFAZOLIN SODIUM-DEXTROSE 2-3 GM-% IV SOLR
2.0000 g | INTRAVENOUS | Status: AC
  Administered 2015-10-05: 2 g via INTRAVENOUS

## 2015-10-05 ENCOUNTER — Encounter (HOSPITAL_COMMUNITY)
Admission: AD | Disposition: A | Discharge status: Home Health | Payer: Self-pay | Source: Ambulatory Visit | Attending: Orthopedic Surgery

## 2015-10-05 ENCOUNTER — Inpatient Hospital Stay (HOSPITAL_COMMUNITY)
Admission: AD | Admit: 2015-10-05 | Discharge: 2015-10-07 | DRG: 470 | Disposition: A | Discharge status: Home Health | Payer: Medicare HMO | Source: Ambulatory Visit | Attending: Orthopedic Surgery | Admitting: Orthopedic Surgery

## 2015-10-05 ENCOUNTER — Inpatient Hospital Stay (HOSPITAL_COMMUNITY): Payer: Medicare HMO | Admitting: Vascular Surgery

## 2015-10-05 ENCOUNTER — Inpatient Hospital Stay (HOSPITAL_COMMUNITY): Payer: Medicare HMO

## 2015-10-05 ENCOUNTER — Inpatient Hospital Stay (HOSPITAL_COMMUNITY): Payer: Medicare HMO | Admitting: Anesthesiology

## 2015-10-05 DIAGNOSIS — Z7982 Long term (current) use of aspirin: Secondary | ICD-10-CM

## 2015-10-05 DIAGNOSIS — E119 Type 2 diabetes mellitus without complications: Secondary | ICD-10-CM | POA: Diagnosis present

## 2015-10-05 DIAGNOSIS — Z8673 Personal history of transient ischemic attack (TIA), and cerebral infarction without residual deficits: Secondary | ICD-10-CM | POA: Diagnosis not present

## 2015-10-05 DIAGNOSIS — Z87891 Personal history of nicotine dependence: Secondary | ICD-10-CM

## 2015-10-05 DIAGNOSIS — Z791 Long term (current) use of non-steroidal anti-inflammatories (NSAID): Secondary | ICD-10-CM

## 2015-10-05 DIAGNOSIS — I2510 Atherosclerotic heart disease of native coronary artery without angina pectoris: Secondary | ICD-10-CM | POA: Diagnosis present

## 2015-10-05 DIAGNOSIS — Z96642 Presence of left artificial hip joint: Secondary | ICD-10-CM | POA: Diagnosis present

## 2015-10-05 DIAGNOSIS — D62 Acute posthemorrhagic anemia: Secondary | ICD-10-CM | POA: Diagnosis not present

## 2015-10-05 DIAGNOSIS — I10 Essential (primary) hypertension: Secondary | ICD-10-CM | POA: Diagnosis present

## 2015-10-05 DIAGNOSIS — Z881 Allergy status to other antibiotic agents status: Secondary | ICD-10-CM | POA: Diagnosis not present

## 2015-10-05 DIAGNOSIS — M25561 Pain in right knee: Secondary | ICD-10-CM | POA: Diagnosis present

## 2015-10-05 DIAGNOSIS — J9819 Other pulmonary collapse: Secondary | ICD-10-CM | POA: Diagnosis present

## 2015-10-05 DIAGNOSIS — I252 Old myocardial infarction: Secondary | ICD-10-CM

## 2015-10-05 DIAGNOSIS — Z96659 Presence of unspecified artificial knee joint: Secondary | ICD-10-CM

## 2015-10-05 DIAGNOSIS — Z951 Presence of aortocoronary bypass graft: Secondary | ICD-10-CM

## 2015-10-05 DIAGNOSIS — M1711 Unilateral primary osteoarthritis, right knee: Secondary | ICD-10-CM | POA: Diagnosis present

## 2015-10-05 HISTORY — PX: TOTAL KNEE ARTHROPLASTY: SHX125

## 2015-10-05 LAB — CBC
HEMATOCRIT: 41.8 % (ref 39.0–52.0)
HEMOGLOBIN: 14.2 g/dL (ref 13.0–17.0)
MCH: 31 pg (ref 26.0–34.0)
MCHC: 34 g/dL (ref 30.0–36.0)
MCV: 91.3 fL (ref 78.0–100.0)
Platelets: 138 10*3/uL — ABNORMAL LOW (ref 150–400)
RBC: 4.58 MIL/uL (ref 4.22–5.81)
RDW: 13.2 % (ref 11.5–15.5)
WBC: 11.4 10*3/uL — AB (ref 4.0–10.5)

## 2015-10-05 LAB — CREATININE, SERUM: CREATININE: 0.75 mg/dL (ref 0.61–1.24)

## 2015-10-05 LAB — GLUCOSE, CAPILLARY: Glucose-Capillary: 105 mg/dL — ABNORMAL HIGH (ref 65–99)

## 2015-10-05 SURGERY — ARTHROPLASTY, KNEE, TOTAL
Anesthesia: Monitor Anesthesia Care | Site: Knee | Laterality: Right

## 2015-10-05 MED ORDER — ENOXAPARIN SODIUM 30 MG/0.3ML ~~LOC~~ SOLN: 30.0000 mg | Freq: Two times a day (BID) | SUBCUTANEOUS | Status: DC

## 2015-10-05 MED ORDER — DIAZEPAM 2 MG PO TABS: 2.0000 mg | ORAL_TABLET | Freq: Three times a day (TID) | ORAL | Status: DC | PRN

## 2015-10-05 MED ORDER — ONDANSETRON HCL 4 MG PO TABS: 4.0000 mg | ORAL_TABLET | Freq: Four times a day (QID) | ORAL | Status: DC | PRN

## 2015-10-05 MED ORDER — SODIUM CHLORIDE 0.9 % IR SOLN
Status: DC | PRN
  Administered 2015-10-05: 1000 mL

## 2015-10-05 MED ORDER — PHENYLEPHRINE 40 MCG/ML (10ML) SYRINGE FOR IV PUSH (FOR BLOOD PRESSURE SUPPORT)
PREFILLED_SYRINGE | INTRAVENOUS | Status: AC
  Filled 2015-10-05: qty 20

## 2015-10-05 MED ORDER — ATORVASTATIN CALCIUM 80 MG PO TABS
80.0000 mg | ORAL_TABLET | Freq: Every evening | ORAL | Status: DC
  Administered 2015-10-05 – 2015-10-06 (×2): 80 mg via ORAL
  Filled 2015-10-05 (×2): qty 1

## 2015-10-05 MED ORDER — HYDROMORPHONE HCL 1 MG/ML IJ SOLN
0.5000 mg | INTRAMUSCULAR | Status: DC | PRN
  Administered 2015-10-05 – 2015-10-06 (×2): 1 mg via INTRAVENOUS
  Filled 2015-10-05 (×2): qty 1

## 2015-10-05 MED ORDER — POLYETHYLENE GLYCOL 3350 17 G PO PACK: 17.0000 g | PACK | Freq: Every day | ORAL | Status: DC | PRN

## 2015-10-05 MED ORDER — ZOLPIDEM TARTRATE 5 MG PO TABS: 5.0000 mg | ORAL_TABLET | Freq: Every evening | ORAL | Status: DC | PRN

## 2015-10-05 MED ORDER — PHENYLEPHRINE 40 MCG/ML (10ML) SYRINGE FOR IV PUSH (FOR BLOOD PRESSURE SUPPORT)
PREFILLED_SYRINGE | INTRAVENOUS | Status: AC
  Filled 2015-10-05: qty 10

## 2015-10-05 MED ORDER — DOCUSATE SODIUM 100 MG PO CAPS
100.0000 mg | ORAL_CAPSULE | Freq: Two times a day (BID) | ORAL | Status: DC
  Administered 2015-10-06 – 2015-10-07 (×3): 100 mg via ORAL
  Filled 2015-10-05 (×3): qty 1

## 2015-10-05 MED ORDER — VITAMIN B-12 1000 MCG PO TABS
1000.0000 ug | ORAL_TABLET | Freq: Every day | ORAL | Status: DC
  Administered 2015-10-06 – 2015-10-07 (×2): 1000 ug via ORAL
  Filled 2015-10-05 (×2): qty 1

## 2015-10-05 MED ORDER — ALUM & MAG HYDROXIDE-SIMETH 200-200-20 MG/5ML PO SUSP: 30.0000 mL | ORAL | Status: DC | PRN

## 2015-10-05 MED ORDER — METOCLOPRAMIDE HCL 5 MG/ML IJ SOLN: 5.0000 mg | Freq: Three times a day (TID) | INTRAMUSCULAR | Status: DC | PRN

## 2015-10-05 MED ORDER — BISACODYL 10 MG RE SUPP: 10.0000 mg | Freq: Every day | RECTAL | Status: DC | PRN

## 2015-10-05 MED ORDER — ENOXAPARIN SODIUM 30 MG/0.3ML ~~LOC~~ SOLN
30.0000 mg | Freq: Two times a day (BID) | SUBCUTANEOUS | Status: DC
  Administered 2015-10-06 – 2015-10-07 (×3): 30 mg via SUBCUTANEOUS
  Filled 2015-10-05 (×3): qty 0.3

## 2015-10-05 MED ORDER — BISACODYL 5 MG PO TBEC: 5.0000 mg | DELAYED_RELEASE_TABLET | Freq: Every day | ORAL | Status: DC | PRN

## 2015-10-05 MED ORDER — CARVEDILOL 3.125 MG PO TABS
3.1250 mg | ORAL_TABLET | Freq: Two times a day (BID) | ORAL | Status: DC
  Administered 2015-10-05 – 2015-10-07 (×4): 3.125 mg via ORAL
  Filled 2015-10-05 (×4): qty 1

## 2015-10-05 MED ORDER — ONDANSETRON HCL 4 MG/2ML IJ SOLN
4.0000 mg | Freq: Four times a day (QID) | INTRAMUSCULAR | Status: DC | PRN
  Administered 2015-10-05 – 2015-10-06 (×2): 4 mg via INTRAVENOUS
  Filled 2015-10-05 (×2): qty 2

## 2015-10-05 MED ORDER — CEFAZOLIN SODIUM-DEXTROSE 2-3 GM-% IV SOLR
INTRAVENOUS | Status: AC
  Filled 2015-10-05: qty 50

## 2015-10-05 MED ORDER — PROMETHAZINE HCL 25 MG/ML IJ SOLN: 6.2500 mg | INTRAMUSCULAR | Status: DC | PRN

## 2015-10-05 MED ORDER — POTASSIUM CHLORIDE IN NACL 20-0.9 MEQ/L-% IV SOLN
INTRAVENOUS | Status: DC
  Administered 2015-10-05: 17:00:00 via INTRAVENOUS
  Filled 2015-10-05: qty 1000

## 2015-10-05 MED ORDER — FUROSEMIDE 40 MG PO TABS
40.0000 mg | ORAL_TABLET | Freq: Every day | ORAL | Status: DC
  Administered 2015-10-06 – 2015-10-07 (×2): 40 mg via ORAL
  Filled 2015-10-05 (×2): qty 1

## 2015-10-05 MED ORDER — SODIUM CHLORIDE 0.9 % IJ SOLN
INTRAMUSCULAR | Status: DC | PRN
  Administered 2015-10-05: 40 mL

## 2015-10-05 MED ORDER — CELECOXIB 200 MG PO CAPS
200.0000 mg | ORAL_CAPSULE | Freq: Two times a day (BID) | ORAL | Status: DC
  Administered 2015-10-05 – 2015-10-07 (×4): 200 mg via ORAL
  Filled 2015-10-05 (×4): qty 1

## 2015-10-05 MED ORDER — FERROUS SULFATE 325 (65 FE) MG PO TABS
325.0000 mg | ORAL_TABLET | Freq: Every evening | ORAL | Status: DC
  Administered 2015-10-05 – 2015-10-06 (×2): 325 mg via ORAL
  Filled 2015-10-05 (×2): qty 1

## 2015-10-05 MED ORDER — DIPHENHYDRAMINE HCL 50 MG/ML IJ SOLN
INTRAMUSCULAR | Status: AC
  Filled 2015-10-05: qty 1

## 2015-10-05 MED ORDER — LIDOCAINE HCL (CARDIAC) 20 MG/ML IV SOLN
INTRAVENOUS | Status: DC | PRN
  Administered 2015-10-05: 50 mg via INTRAVENOUS

## 2015-10-05 MED ORDER — VECURONIUM BROMIDE 10 MG IV SOLR
INTRAVENOUS | Status: AC
  Filled 2015-10-05: qty 10

## 2015-10-05 MED ORDER — FENTANYL CITRATE (PF) 250 MCG/5ML IJ SOLN
INTRAMUSCULAR | Status: AC
  Filled 2015-10-05: qty 5

## 2015-10-05 MED ORDER — MENTHOL 3 MG MT LOZG: 1.0000 | LOZENGE | OROMUCOSAL | Status: DC | PRN

## 2015-10-05 MED ORDER — FENTANYL CITRATE (PF) 100 MCG/2ML IJ SOLN
INTRAMUSCULAR | Status: DC | PRN
  Administered 2015-10-05 (×2): 50 ug via INTRAVENOUS

## 2015-10-05 MED ORDER — MAGNESIUM CITRATE PO SOLN: 1.0000 | Freq: Once | ORAL | Status: DC | PRN

## 2015-10-05 MED ORDER — PROPOFOL 500 MG/50ML IV EMUL
INTRAVENOUS | Status: DC | PRN
  Administered 2015-10-05: 25 ug/kg/min via INTRAVENOUS

## 2015-10-05 MED ORDER — SODIUM CHLORIDE 0.9 % IJ SOLN
INTRAMUSCULAR | Status: AC
  Filled 2015-10-05: qty 10

## 2015-10-05 MED ORDER — BUPIVACAINE HCL 0.5 % IJ SOLN
INTRAMUSCULAR | Status: DC | PRN
  Administered 2015-10-05: 10 mL

## 2015-10-05 MED ORDER — ACETAMINOPHEN 325 MG PO TABS: 650.0000 mg | ORAL_TABLET | Freq: Four times a day (QID) | ORAL | Status: DC | PRN

## 2015-10-05 MED ORDER — OXYCODONE HCL 5 MG PO TABS
5.0000 mg | ORAL_TABLET | ORAL | Status: DC | PRN
  Administered 2015-10-05: 5 mg via ORAL
  Administered 2015-10-06 – 2015-10-07 (×6): 10 mg via ORAL
  Filled 2015-10-05 (×4): qty 2
  Filled 2015-10-05: qty 1
  Filled 2015-10-05 (×2): qty 2

## 2015-10-05 MED ORDER — AMLODIPINE BESYLATE 10 MG PO TABS
10.0000 mg | ORAL_TABLET | Freq: Every day | ORAL | Status: DC
  Administered 2015-10-05 – 2015-10-06 (×2): 10 mg via ORAL
  Filled 2015-10-05 (×3): qty 1

## 2015-10-05 MED ORDER — METOCLOPRAMIDE HCL 5 MG PO TABS: 5.0000 mg | ORAL_TABLET | Freq: Three times a day (TID) | ORAL | Status: DC | PRN

## 2015-10-05 MED ORDER — ONDANSETRON HCL 4 MG/2ML IJ SOLN
INTRAMUSCULAR | Status: AC
  Filled 2015-10-05: qty 2

## 2015-10-05 MED ORDER — DIPHENHYDRAMINE HCL 50 MG/ML IJ SOLN
INTRAMUSCULAR | Status: DC | PRN
  Administered 2015-10-05: 25 mg via INTRAVENOUS

## 2015-10-05 MED ORDER — PHENOL 1.4 % MT LIQD: 1.0000 | OROMUCOSAL | Status: DC | PRN

## 2015-10-05 MED ORDER — HYDROMORPHONE HCL 1 MG/ML IJ SOLN: 0.2500 mg | INTRAMUSCULAR | Status: DC | PRN

## 2015-10-05 MED ORDER — ACETAMINOPHEN 650 MG RE SUPP: 650.0000 mg | Freq: Four times a day (QID) | RECTAL | Status: DC | PRN

## 2015-10-05 MED ORDER — ONDANSETRON HCL 4 MG PO TABS: 4.0000 mg | ORAL_TABLET | Freq: Three times a day (TID) | ORAL | Status: DC | PRN

## 2015-10-05 MED ORDER — DIPHENHYDRAMINE HCL 12.5 MG/5ML PO ELIX
12.5000 mg | ORAL_SOLUTION | ORAL | Status: DC | PRN
Start: 2015-10-05 — End: 2015-10-07

## 2015-10-05 MED ORDER — BUPIVACAINE LIPOSOME 1.3 % IJ SUSP
20.0000 mL | Freq: Once | INTRAMUSCULAR | Status: DC
  Filled 2015-10-05: qty 20

## 2015-10-05 MED ORDER — BUPIVACAINE HCL (PF) 0.5 % IJ SOLN
INTRAMUSCULAR | Status: AC
  Filled 2015-10-05: qty 30

## 2015-10-05 MED ORDER — DEXAMETHASONE SODIUM PHOSPHATE 10 MG/ML IJ SOLN
10.0000 mg | Freq: Once | INTRAMUSCULAR | Status: AC
  Administered 2015-10-06: 10 mg via INTRAVENOUS
  Filled 2015-10-05: qty 1

## 2015-10-05 MED ORDER — OXYCODONE-ACETAMINOPHEN 5-325 MG PO TABS: 1.0000 | ORAL_TABLET | ORAL | Status: DC | PRN

## 2015-10-05 MED ORDER — STERILE WATER FOR INJECTION IJ SOLN
INTRAMUSCULAR | Status: AC
  Filled 2015-10-05: qty 10

## 2015-10-05 MED ORDER — TIOTROPIUM BROMIDE-OLODATEROL 2.5-2.5 MCG/ACT IN AERS: 1.0000 | INHALATION_SPRAY | Freq: Every day | RESPIRATORY_TRACT | Status: DC | PRN

## 2015-10-05 MED ORDER — DEXAMETHASONE SODIUM PHOSPHATE 10 MG/ML IJ SOLN
INTRAMUSCULAR | Status: AC
  Filled 2015-10-05: qty 1

## 2015-10-05 MED ORDER — CEFAZOLIN SODIUM-DEXTROSE 2-3 GM-% IV SOLR
2.0000 g | Freq: Four times a day (QID) | INTRAVENOUS | Status: AC
  Administered 2015-10-05 (×2): 2 g via INTRAVENOUS
  Filled 2015-10-05 (×3): qty 50

## 2015-10-05 MED ORDER — ROCURONIUM BROMIDE 50 MG/5ML IV SOLN
INTRAVENOUS | Status: AC
  Filled 2015-10-05: qty 1

## 2015-10-05 SURGICAL SUPPLY — 71 items
BANDAGE ACE 4X5 VEL STRL LF (GAUZE/BANDAGES/DRESSINGS) ×3 IMPLANT
BANDAGE ACE 6X5 VEL STRL LF (GAUZE/BANDAGES/DRESSINGS) ×3 IMPLANT
BANDAGE ELASTIC 4 VELCRO ST LF (GAUZE/BANDAGES/DRESSINGS) ×3 IMPLANT
BANDAGE ELASTIC 6 VELCRO ST LF (GAUZE/BANDAGES/DRESSINGS) ×3 IMPLANT
BANDAGE ESMARK 6X9 LF (GAUZE/BANDAGES/DRESSINGS) ×1 IMPLANT
BENZOIN TINCTURE PRP APPL 2/3 (GAUZE/BANDAGES/DRESSINGS) ×3 IMPLANT
BLADE SAG 18X100X1.27 (BLADE) ×6 IMPLANT
BNDG ESMARK 6X9 LF (GAUZE/BANDAGES/DRESSINGS) ×3
BOWL SMART MIX CTS (DISPOSABLE) ×3 IMPLANT
CAPT KNEE TOTAL 3 ×3 IMPLANT
CEMENT BONE SIMPLEX SPEEDSET (Cement) ×6 IMPLANT
CLOSURE WOUND 1/2 X4 (GAUZE/BANDAGES/DRESSINGS) ×2
COVER SURGICAL LIGHT HANDLE (MISCELLANEOUS) ×3 IMPLANT
CUFF TOURNIQUET SINGLE 34IN LL (TOURNIQUET CUFF) ×3 IMPLANT
DRAPE EXTREMITY T 121X128X90 (DRAPE) ×3 IMPLANT
DRAPE IMP U-DRAPE 54X76 (DRAPES) ×3 IMPLANT
DRAPE INCISE IOBAN 66X45 STRL (DRAPES) ×3 IMPLANT
DRAPE PROXIMA HALF (DRAPES) ×3 IMPLANT
DRAPE U-SHAPE 47X51 STRL (DRAPES) ×3 IMPLANT
DRSG PAD ABDOMINAL 8X10 ST (GAUZE/BANDAGES/DRESSINGS) ×3 IMPLANT
DURAPREP 26ML APPLICATOR (WOUND CARE) ×6 IMPLANT
ELECT CAUTERY BLADE 6.4 (BLADE) ×3 IMPLANT
ELECT REM PT RETURN 9FT ADLT (ELECTROSURGICAL) ×3
ELECTRODE REM PT RTRN 9FT ADLT (ELECTROSURGICAL) ×1 IMPLANT
EVACUATOR 1/8 PVC DRAIN (DRAIN) ×3 IMPLANT
FACESHIELD WRAPAROUND (MASK) ×6 IMPLANT
GAUZE SPONGE 4X4 12PLY STRL (GAUZE/BANDAGES/DRESSINGS) ×3 IMPLANT
GLOVE BIOGEL PI IND STRL 7.0 (GLOVE) ×1 IMPLANT
GLOVE BIOGEL PI INDICATOR 7.0 (GLOVE) ×2
GLOVE ORTHO TXT STRL SZ7.5 (GLOVE) ×3 IMPLANT
GLOVE SURG ORTHO 7.0 STRL STRW (GLOVE) ×3 IMPLANT
GOWN STRL REUS W/ TWL LRG LVL3 (GOWN DISPOSABLE) ×2 IMPLANT
GOWN STRL REUS W/ TWL XL LVL3 (GOWN DISPOSABLE) ×1 IMPLANT
GOWN STRL REUS W/TWL LRG LVL3 (GOWN DISPOSABLE) ×4
GOWN STRL REUS W/TWL XL LVL3 (GOWN DISPOSABLE) ×2
HANDPIECE INTERPULSE COAX TIP (DISPOSABLE) ×2
IMMOBILIZER KNEE 22 UNIV (SOFTGOODS) ×6 IMPLANT
IMMOBILIZER KNEE 24 THIGH 36 (MISCELLANEOUS) IMPLANT
IMMOBILIZER KNEE 24 UNIV (MISCELLANEOUS)
KIT BASIN OR (CUSTOM PROCEDURE TRAY) ×3 IMPLANT
KIT ROOM TURNOVER OR (KITS) ×3 IMPLANT
MANIFOLD NEPTUNE II (INSTRUMENTS) ×3 IMPLANT
NEEDLE 18GX1X1/2 (RX/OR ONLY) (NEEDLE) ×3 IMPLANT
NEEDLE HYPO 25GX1X1/2 BEV (NEEDLE) ×3 IMPLANT
NS IRRIG 1000ML POUR BTL (IV SOLUTION) ×3 IMPLANT
PACK TOTAL JOINT (CUSTOM PROCEDURE TRAY) ×3 IMPLANT
PACK UNIVERSAL I (CUSTOM PROCEDURE TRAY) ×3 IMPLANT
PAD ABD 8X10 STRL (GAUZE/BANDAGES/DRESSINGS) ×3 IMPLANT
PAD ARMBOARD 7.5X6 YLW CONV (MISCELLANEOUS) ×6 IMPLANT
PAD CAST 4YDX4 CTTN HI CHSV (CAST SUPPLIES) ×1 IMPLANT
PADDING CAST ABS 4INX4YD NS (CAST SUPPLIES) ×2
PADDING CAST ABS 6INX4YD NS (CAST SUPPLIES) ×2
PADDING CAST ABS COTTON 4X4 ST (CAST SUPPLIES) ×1 IMPLANT
PADDING CAST ABS COTTON 6X4 NS (CAST SUPPLIES) ×1 IMPLANT
PADDING CAST COTTON 4X4 STRL (CAST SUPPLIES) ×2
SET HNDPC FAN SPRY TIP SCT (DISPOSABLE) ×1 IMPLANT
STRIP CLOSURE SKIN 1/2X4 (GAUZE/BANDAGES/DRESSINGS) ×4 IMPLANT
SUCTION FRAZIER HANDLE 10FR (MISCELLANEOUS) ×2
SUCTION TUBE FRAZIER 10FR DISP (MISCELLANEOUS) ×1 IMPLANT
SUT MNCRL AB 4-0 PS2 18 (SUTURE) ×3 IMPLANT
SUT VIC AB 0 CT1 27 (SUTURE)
SUT VIC AB 0 CT1 27XBRD ANBCTR (SUTURE) IMPLANT
SUT VIC AB 1 CTX 36 (SUTURE) ×4
SUT VIC AB 1 CTX36XBRD ANBCTR (SUTURE) ×2 IMPLANT
SUT VIC AB 2-0 CT1 27 (SUTURE) ×4
SUT VIC AB 2-0 CT1 TAPERPNT 27 (SUTURE) ×2 IMPLANT
SYR 50ML LL SCALE MARK (SYRINGE) ×3 IMPLANT
SYR CONTROL 10ML LL (SYRINGE) ×3 IMPLANT
TOWEL OR 17X24 6PK STRL BLUE (TOWEL DISPOSABLE) ×3 IMPLANT
TOWEL OR 17X26 10 PK STRL BLUE (TOWEL DISPOSABLE) ×3 IMPLANT
WATER STERILE IRR 1000ML POUR (IV SOLUTION) ×3 IMPLANT

## 2015-10-05 NOTE — Progress Notes (Signed)
Utilization review completed.  

## 2015-10-05 NOTE — Evaluation (Signed)
Physical Therapy Evaluation Patient Details Name: [name] MRN: [id number] DOB: 1933-02-22 Today's Date: 10/05/2015   History of Present Illness  Admitted for RTKA;  has a past medical history of Pneumonia (08/2011); Myocardial infarction (HCC) (02/23/2009); Coronary artery disease; Diabetes mellitus; Arthritis; Anemia; Stroke (HCC); Carotid artery stenosis; Complication of anesthesia; and Hypertension.  has past surgical history that includes Hand surgery; Rotator cuff repair; Carpal tunnel release; Joint replacement; Coronary artery bypass graft (2010); Back surgery (8657,8469, 2011, 03/2011); Total hip arthroplasty (06/18/2012); Total hip arthroplasty (06/18/2012); Injection knee (06/18/2012); Eye surgery (Bilateral); Back surgery (01/2015); and Hand surgery (Left).  Clinical Impression   Pt is s/p TKA resulting in the deficits listed below (see PT Problem List).  Pt will benefit from skilled PT to increase their independence and safety with mobility to allow discharge to the venue listed below.      Follow Up Recommendations Home health PT;Supervision/Assistance - 24 hour    Equipment Recommendations  Rolling walker with 5" wheels;3in1 (PT)    Recommendations for Other Services OT consult     Precautions / Restrictions Precautions Precautions: Knee;Fall Precaution Comments: Pt educated to not allow any pillow or bolster under knee for healing with optimal range of motion.  Required Braces or Orthoses: Knee Immobilizer - Right Knee Immobilizer - Right:  (No order for KI, but it is in room) Restrictions Weight Bearing Restrictions: Yes RLE Weight Bearing: Weight bearing as tolerated      Mobility  Bed Mobility Overal bed mobility: Needs Assistance Bed Mobility: Supine to Sit     Supine to sit: Min assist     General bed mobility comments: Cues for technqiue; min assist to pull to sit  Transfers Overall transfer level: Needs assistance Equipment used: Rolling walker  (2 wheeled) Transfers: Sit to/from Stand Sit to Stand: Mod assist         General transfer comment: Cues for hand placement and safety; mod assist to initiate boost  Ambulation/Gait Ambulation/Gait assistance: Min guard Ambulation Distance (Feet): 30 Feet Assistive device: Rolling walker (2 wheeled) Gait Pattern/deviations: Step-to pattern;Antalgic;Trunk flexed     General Gait Details: Cues for gait sequence and to activate R quad for stance stability; cues for posture as well  Stairs            Wheelchair Mobility    Modified Rankin (Stroke Patients Only)       Balance Overall balance assessment: Needs assistance           Standing balance-Leahy Scale: Poor                               Pertinent Vitals/Pain Pain Assessment: Faces Faces Pain Scale: Hurts even more Pain Location: R knee with flexion Pain Descriptors / Indicators: Aching;Grimacing Pain Intervention(s): Limited activity within patient's tolerance;Monitored during session;Repositioned    Home Living Family/patient expects to be discharged to:: Private residence Living Arrangements: Spouse/significant other Available Help at Discharge: Family Type of Home: House Home Access: Ramped entrance     Home Layout: One level Home Equipment: Environmental consultant - 2 wheels      Prior Function Level of Independence: Independent with assistive device(s)               Hand Dominance        Extremity/Trunk Assessment   Upper Extremity Assessment: Overall WFL for tasks assessed           Lower Extremity Assessment: RLE deficits/detail RLE  Deficits / Details: Grossly decr AROM and strength, limited by pain postop       Communication   Communication: No difficulties  Cognition Arousal/Alertness: Awake/alert Behavior During Therapy: WFL for tasks assessed/performed;Impulsive Overall Cognitive Status: Within Functional Limits for tasks assessed                       General Comments General comments (skin integrity, edema, etc.): Session conducted on supplemental O2    Exercises        Assessment/Plan    PT Assessment Patient needs continued PT services  PT Diagnosis Difficulty walking;Acute pain   PT Problem List Decreased strength;Decreased range of motion;Decreased activity tolerance;Decreased balance;Decreased mobility;Decreased knowledge of use of DME;Decreased safety awareness;Decreased knowledge of precautions;Pain  PT Treatment Interventions DME instruction;Gait training;Functional mobility training;Therapeutic activities;Therapeutic exercise;Balance training;Patient/family education   PT Goals (Current goals can be found in the Care Plan section) Acute Rehab PT Goals Patient Stated Goal: less pain PT Goal Formulation: With patient Time For Goal Achievement: 10/12/15 Potential to Achieve Goals: Good    Frequency 7X/week   Barriers to discharge   Pt's wife can only give limted assist    Co-evaluation               End of Session Equipment Utilized During Treatment: Gait belt;Right knee immobilizer Activity Tolerance: Patient tolerated treatment well Patient left: in chair;with call bell/phone within reach;with chair alarm set Nurse Communication: Mobility status         Time: 1610-9604 PT Time Calculation (min) (ACUTE ONLY): 24 min   Charges:   PT Evaluation $PT Eval Moderate Complexity: 1 Procedure PT Treatments $Gait Training: 8-22 mins   PT G Codes:        Olen Pel 10/05/2015, 4:31 PM  [place], [facility]  Acute Rehabilitation Services Pager [phone] Office [phone]

## 2015-10-05 NOTE — Anesthesia Procedure Notes (Signed)
Procedure Name: MAC Date/Time: 10/05/2015 10:11 AM Performed by: Marena Chancy Pre-anesthesia Checklist: Patient identified, Timeout performed, Emergency Drugs available, Suction available and Patient being monitored Patient Re-evaluated:Patient Re-evaluated prior to inductionOxygen Delivery Method: Simple face mask

## 2015-10-05 NOTE — Transfer of Care (Signed)
Immediate Anesthesia Transfer of Care Note  Patient: [name]  Procedure(s) Performed: Procedure(s): RIGHT TOTAL KNEE ARTHROPLASTY (Right)  Patient Location: PACU  Anesthesia Type:MAC and Spinal  Level of Consciousness: awake, alert , oriented and patient cooperative  Airway & Oxygen Therapy: Patient Spontanous Breathing and Patient connected to nasal cannula oxygen  Post-op Assessment: Report given to RN and Post -op Vital signs reviewed and stable  Post vital signs: Reviewed and stable  Last Vitals:  Filed Vitals:   10/05/15 0945 10/05/15 1211  BP: 217/71   Pulse: 58   Temp: 36.4 C 36.5 C    Complications: No apparent anesthesia complications

## 2015-10-05 NOTE — Op Note (Signed)
NAME:  WOODS, GANGEMI NO.:  [id number]  MEDICAL RECORD NO.:  [id number]  LOCATION:  5N02C                        FACILITY:  [facility]  PHYSICIAN:  Loreta Ave, M.D. DATE OF BIRTH:  28-Jan-1933  DATE OF PROCEDURE:  10/05/2015 DATE OF DISCHARGE:                              OPERATIVE REPORT   PREOPERATIVE DIAGNOSIS:  Right knee end-stage arthritis, primary generalized.  POSTOPERATIVE DIAGNOSIS:  Right knee end-stage arthritis, primary generalized.  PROCEDURE:  Right modified minimally invasive total knee replacement with Stryker triathlon prosthesis.  Cemented pegged posterior stabilized #6 femoral component.  Cemented #6 tibial component 9 mm PS insert. Cemented resurfacing 38-mm patellar component.  SURGEON:  Loreta Ave, M.D.  ASSISTANT:  Mikey Kirschner, PA, present throughout the entire case and necessary for timely completion of procedure.  ANESTHESIA:  Spinal.  BLOOD LOSS:  Minimal.  SPECIMENS:  None.  CULTURES:  None.  COMPLICATIONS:  None.  DRESSINGS:  Soft compressive knee immobilizer.  TOURNIQUET TIME:  50 minutes.  DESCRIPTION OF PROCEDURE:  The patient was brought to the operating room, placed on the operating table in supine position.  After adequate anesthesia had been obtained, right knee examined.  About 5-7 degrees flexion contracture.  Tourniquet applied.  Prepped and draped in usual sterile fashion.  Exsanguinated with elevation of Esmarch.  Tourniquet inflated to 350 mmHg.  Straight incision above the patella to tibial tubercle.  Medial arthrotomy, vastus splitting, preserving quad tendon. Intramedullary guide distal femur.  10 mm resection 5 degrees of valgus. Using epicondylar axis, the femur was sized, cut, and fitted for posterior stabilized pegged #6 component.  Proximal tibial resection with extramedullary guide.  A 3-degree posterior slope cut.  Size #6 component.  The patella, which was markedly eroded as was  the trochlea was then brought down to reasonable bone stock.  Good bleeding bone. Drilled, sized, and fitted for a 38-mm component.  Trials put in place. A 9-mm insert.  I was pleased with full extension, full flexion, nicely balanced knee, good stability, good patellar tracking.  Tibia was marked for rotation and reamed.  All trials removed.  Copious irrigation with a pulse irrigating device.  Cement prepared, placed on all components, firmly seated.  Polyethylene attached to tibia, knee reduced.  Once the cement hardened, the knee was irrigated again.  Soft tissue was injected with Exparel.  Arthrotomy closed with #1 Vicryl with a subcutaneous subcuticular closure.  Margins were injected with Marcaine.  Sterile compressive dressing applied.  Tourniquet deflated and removed.  Knee immobilizer applied.  Anesthesia reversed.  Brought to the recovery room.  Tolerated the surgery well.  No complications.     Loreta Ave, M.D.     DFM/MEDQ  D:  10/05/2015  T:  10/05/2015  Job:  [id number]

## 2015-10-05 NOTE — H&P (View-Only) (Signed)
TOTAL KNEE ADMISSION H&P  Patient is being admitted for right total knee arthroplasty.  Subjective:  Chief Complaint:right knee pain.  HPI: [name], 82 y.o. male, has a history of pain and functional disability in the right knee due to arthritis and has failed non-surgical conservative treatments for greater than 12 weeks to includeNSAID's and/or analgesics, corticosteriod injections, viscosupplementation injections and activity modification.  Onset of symptoms was gradual, starting >10 years ago with gradually worsening course since that time. The patient noted no past surgery on the bilaterally knee(s).  Patient currently rates pain in the right knee(s) at 5 out of 10 with activity. Patient has night pain, worsening of pain with activity and weight bearing, pain with passive range of motion, crepitus and joint swelling.  Patient has evidence of subchondral sclerosis and joint space narrowing by imaging studies. There is no active infection.  There are no active problems to display for this patient.  Past Medical History  Diagnosis Date  . Pneumonia 08/2011    hx of  . Anginal pain   . Myocardial infarction (HCC) 02/23/2009  . Coronary artery disease   . Diabetes mellitus   . Arthritis   . Anemia   . Stroke     dysarthria, now resolved '07  . Carotid artery stenosis     RICAS 80-99%, LICAS 60-79% 05/10/11 @ [facility] - Dr. Judithe Modest (refused surgical evaluation)    Past Surgical History  Procedure Laterality Date  . Appendectomy    . Hand surgery      correction of webbed fingers bilat  . Rotator cuff repair      left  . Carpal tunnel release      L  . Joint replacement      L shoulder  . Coronary artery bypass graft  2010    triple bypass  . Back surgery  [id number], 2011, 03/2011    lumbar and cervical  . Total hip arthroplasty  06/18/2012    left  . Total hip arthroplasty  06/18/2012    Procedure: TOTAL HIP ARTHROPLASTY;  Surgeon: Loreta Ave, MD;  Location: [facility]  OR;  Service: Orthopedics;  Laterality: Left;  left total hip arthroplasty with right knee injection  . Injection knee  06/18/2012    Procedure: KNEE INJECTION;  Surgeon: Loreta Ave, MD;  Location: [facility] OR;  Service: Orthopedics;  Laterality: Right;     (Not in a hospital admission) Allergies  Allergen Reactions  . Erythromycin Base Itching and Rash    Social History  Substance Use Topics  . Smoking status: Former Smoker    Quit date: 06/18/1979  . Smokeless tobacco: Never Used  . Alcohol Use: No    No family history on file.   Review of Systems  Constitutional: Negative.   HENT: Negative.   Eyes: Negative.   Respiratory: Negative.   Cardiovascular: Negative.   Gastrointestinal: Negative.   Genitourinary: Negative.   Musculoskeletal: Positive for back pain and joint pain.  Skin: Negative.   Neurological: Negative.   Endo/Heme/Allergies: Bruises/bleeds easily.  Psychiatric/Behavioral: Negative.     Objective:  Physical Exam  Constitutional: He is oriented to person, place, and time. He appears well-developed and well-nourished.  HENT:  Head: Normocephalic and atraumatic.  Eyes: EOM are normal. Pupils are equal, round, and reactive to light.  Neck: Normal range of motion. Neck supple.  Cardiovascular: Normal rate and regular rhythm.   Respiratory: Effort normal and breath sounds normal.  GI: Soft. Bowel sounds are normal.  Musculoskeletal:  Markedly antalgic gait on the right.  Negative log roll left hip.  A little pain to log roll on the right, but he still has fairly good motion.  Negative straight leg raise, both sides.  Left knee has 3+ patellofemoral crepitus, but he still has full extension, better than 100 degrees of flexion.  Reasonable alignment and stability.  Right knee 1-2+ effusion.  He can barely straighten the knee out because of patellofemoral crepitus and pain.  Ligaments are stable.  He is tender medial and lateral joint lines with popping with  McMurray's on both sides.  He is neurovascularly intact distally.      Neurological: He is alert and oriented to person, place, and time.  Skin: Skin is warm and dry.  Psychiatric: He has a normal mood and affect. His behavior is normal. Judgment and thought content normal.    Vital signs in last 24 hours: @  Labs:   Estimated body mass index is 28.69 kg/(m^2) as calculated from the following:   Height as of 06/18/12: 5' 10.08" (1.78 m).   Weight as of 06/04/12: 90.9 kg (200 lb 6.4 oz).   Imaging Review Plain radiographs demonstrate severe degenerative joint disease of the right knee(s). The overall alignment ismild varus. The bone quality appears to be fair for age and reported activity level.  Assessment/Plan:  End stage arthritis, right knee   The patient history, physical examination, clinical judgment of the provider and imaging studies are consistent with end stage degenerative joint disease of the right knee(s) and total knee arthroplasty is deemed medically necessary. The treatment options including medical management, injection therapy arthroscopy and arthroplasty were discussed at length. The risks and benefits of total knee arthroplasty were presented and reviewed. The risks due to aseptic loosening, infection, stiffness, patella tracking problems, thromboembolic complications and other imponderables were discussed. The patient acknowledged the explanation, agreed to proceed with the plan and consent was signed. Patient is being admitted for inpatient treatment for surgery, pain control, PT, OT, prophylactic antibiotics, VTE prophylaxis, progressive ambulation and ADL's and discharge planning. The patient is planning to be discharged home with home health services

## 2015-10-05 NOTE — Anesthesia Preprocedure Evaluation (Addendum)
Anesthesia Evaluation  Patient identified by MRN, date of birth, ID band Patient awake    Reviewed: Allergy & Precautions, NPO status , Patient's Chart, lab work & pertinent test results  History of Anesthesia Complications Negative for: history of anesthetic complications  Airway Mallampati: II  TM Distance: >3 FB Neck ROM: Full    Dental no notable dental hx. (+) Dental Advisory Given   Pulmonary former smoker,    Pulmonary exam normal        Cardiovascular hypertension, + CAD, + Past MI and + CABG  Normal cardiovascular exam  REQUESTED.  09/08/14 Nuclear stress test [facility], see Care Everywhere): No inducible ischemia. Normal left ventricular function, EF 61%.  These following records are scanned under the Media tab, Correspondence, Encounter 06/18/12: - His last cardiac cath was on 02/23/09 showing 3V CAD and is now s/p CABG.    Neuro/Psych    GI/Hepatic negative GI ROS, Neg liver ROS,   Endo/Other  diabetes  Renal/GU negative Renal ROS     Musculoskeletal   Abdominal   Peds  Hematology   Anesthesia Other Findings   Reproductive/Obstetrics                         Anesthesia Physical Anesthesia Plan  ASA: III  Anesthesia Plan: MAC and Spinal   Post-op Pain Management:    Induction:   Airway Management Planned: Simple Face Mask and Natural Airway  Additional Equipment:   Intra-op Plan:   Post-operative Plan:   Informed Consent: I have reviewed the patients History and Physical, chart, labs and discussed the procedure including the risks, benefits and alternatives for the proposed anesthesia with the patient or authorized representative who has indicated his/her understanding and acceptance.   Dental advisory given  Plan Discussed with: CRNA, Anesthesiologist and Surgeon  Anesthesia Plan Comments:        Anesthesia Quick Evaluation

## 2015-10-05 NOTE — Discharge Instructions (Signed)

## 2015-10-05 NOTE — Interval H&P Note (Signed)
History and Physical Interval Note:  10/05/2015 8:25 AM  [name]  has presented today for surgery, with the diagnosis of djd right knee  The various methods of treatment have been discussed with the patient and family. After consideration of risks, benefits and other options for treatment, the patient has consented to  Procedure(s): RIGHT TOTAL KNEE ARTHROPLASTY (Right) as a surgical intervention .  The patient's history has been reviewed, patient examined, no change in status, stable for surgery.  I have reviewed the patient's chart and labs.  Questions were answered to the patient's satisfaction.     Loreta Ave

## 2015-10-05 NOTE — Discharge Summary (Addendum)
Patient ID: [name] MRN: [id number] DOB/AGE: Feb 03, 1933 82 y.o.  Admit date: 10/05/2015 Discharge date: 10/07/2015  Admission Diagnoses:  Active Problems:   Total knee replacement status   Discharge Diagnoses:  Same  Past Medical History  Diagnosis Date  . Pneumonia 08/2011    hx of  . Myocardial infarction (HCC) 02/23/2009  . Coronary artery disease   . Diabetes mellitus   . Arthritis   . Anemia   . Stroke [facility])     dysarthria, now resolved '07  . Carotid artery stenosis     RICAS 80-99%, LICAS 60-79% 05/10/11 @ [facility] - Dr. Judithe Modest (refused surgical evaluation)  . Complication of anesthesia     hard time waking up during the hand surgery and breathing tube came out , didn't require ICU doesn't remember which surgery  . Hypertension     Surgeries: Procedure(s): RIGHT TOTAL KNEE ARTHROPLASTY on 10/05/2015   Consultants:    Discharged Condition: Improved  Hospital Course: [name] is an 82 y.o. male who was admitted 10/05/2015 for operative treatment of primary localized osteoarthritis right knee. Patient has severe unremitting pain that affects sleep, daily activities, and work/hobbies. After pre-op clearance the patient was taken to the operating room on 10/05/2015 and underwent  Procedure(s): RIGHT TOTAL KNEE ARTHROPLASTY.  Patient with a pre-op Hb of 15.6 developed ABLA on pod #1 with a Hb of 12.3.  We will continue to follow.  Patient was given perioperative antibiotics:      Anti-infectives    Start     Dose/Rate Route Frequency Ordered Stop   10/05/15 1530  ceFAZolin (ANCEF) IVPB 2 g/50 mL premix     2 g 100 mL/hr over 30 Minutes Intravenous Every 6 hours 10/05/15 1529 10/05/15 2200   10/05/15 0600  ceFAZolin (ANCEF) IVPB 2 g/50 mL premix     2 g 100 mL/hr over 30 Minutes Intravenous On call to O.R. 10/04/15 1302 10/05/15 1021       Patient was given sequential compression devices, early ambulation, and chemoprophylaxis to prevent DVT.  Patient  benefited maximally from hospital stay and there were no complications.    Recent vital signs:  Patient Vitals for the past 24 hrs:  BP Temp Temp src Pulse Resp SpO2  10/07/15 0501 (!) 147/54 mmHg 97.8 F (36.6 C) Oral 64 18 92 %  10/06/15 2051 - - - - - 94 %  10/06/15 2007 (!) 171/61 mmHg 98.9 F (37.2 C) Oral 66 16 92 %  10/06/15 1636 (!) 158/68 mmHg - - 71 - -  10/06/15 1300 (!) 157/65 mmHg 98.1 F (36.7 C) Oral - 16 93 %  10/06/15 1006 - - - - - 94 %  10/06/15 0803 (!) 158/60 mmHg - - 66 - -     Recent laboratory studies:   Recent Labs  10/06/15 0513 10/07/15 0447  WBC 10.5 12.3*  HGB 12.3* 11.9*  HCT 36.2* 34.7*  PLT 121* 108*  NA 135 135  K 4.6 4.7  CL 99* 96*  CO2 28 30  BUN 8 13  CREATININE 0.65 0.82  GLUCOSE 156* 207*  CALCIUM 8.6* 9.1     Discharge Medications:     Medication List    STOP taking these medications        aspirin EC 81 MG tablet     HYDROcodone-acetaminophen 5-325 MG tablet  Commonly known as:  NORCO/VICODIN     methocarbamol 500 MG tablet  Commonly known as:  ROBAXIN  oxyCODONE 5 MG immediate release tablet  Commonly known as:  Oxy IR/ROXICODONE      TAKE these medications        amLODipine 10 MG tablet  Commonly known as:  NORVASC  Take 10 mg by mouth daily.     atorvastatin 80 MG tablet  Commonly known as:  LIPITOR  Take 80 mg by mouth every evening.     bisacodyl 5 MG EC tablet  Commonly known as:  DULCOLAX  Take 1 tablet (5 mg total) by mouth daily as needed for moderate constipation.     carvedilol 3.125 MG tablet  Commonly known as:  COREG  Take 3.125 mg by mouth 2 (two) times daily with a meal.     enoxaparin 30 MG/0.3ML injection  Commonly known as:  LOVENOX  Inject 0.3 mLs (30 mg total) into the skin every 12 (twelve) hours. Use Lovenox injections as directed q12 hours x 14 days following surgery to prevent blood clots     etodolac 400 MG tablet  Commonly known as:  LODINE  Take 400 mg by mouth 2  (two) times daily as needed for moderate pain.     ferrous sulfate 325 (65 FE) MG tablet  Take 325 mg by mouth every evening.     furosemide 40 MG tablet  Commonly known as:  LASIX  Take 40 mg by mouth daily.     ondansetron 4 MG tablet  Commonly known as:  ZOFRAN  Take 1 tablet (4 mg total) by mouth every 8 (eight) hours as needed for nausea or vomiting.     oxyCODONE-acetaminophen 5-325 MG tablet  Commonly known as:  ROXICET  Take 1-2 tablets by mouth every 4 (four) hours as needed.     STIOLTO RESPIMAT 2.5-2.5 MCG/ACT Aers  Generic drug:  Tiotropium Bromide-Olodaterol  Inhale 1-2 puffs into the lungs daily as needed (for wheezing or shortness of breath).     triamcinolone cream 0.1 %  Commonly known as:  KENALOG  Apply 1 application topically daily as needed (for itching).     vitamin B-12 1000 MCG tablet  Commonly known as:  CYANOCOBALAMIN  Take 1,000 mcg by mouth daily.        Diagnostic Studies: Dg Knee Right Port  10/05/2015  CLINICAL DATA:  Total right knee replacement status EXAM: PORTABLE RIGHT KNEE - 1-2 VIEW COMPARISON:  None. FINDINGS: Status post total knee arthroplasty. Soft tissue gas is present. Gas identified within the joint capsule. Alignment appears normal. No acute fracture. IMPRESSION: Status post total knee arthroplasty.  No adverse features. Electronically Signed   By: Norva Pavlov M.D.   On: 10/05/2015 12:47    Disposition: 01-Home or Self Care    Follow-up Information    Follow up with Loreta Ave, MD. Schedule an appointment as soon as possible for a visit in 2 weeks.   Specialty:  Orthopedic Surgery   Contact information:   [address] [place] [address] [phone]        Signed: Otilio Saber 10/07/2015, 7:45 AM

## 2015-10-06 ENCOUNTER — Encounter (HOSPITAL_COMMUNITY): Payer: Self-pay | Admitting: General Practice

## 2015-10-06 LAB — CBC
HEMATOCRIT: 36.2 % — AB (ref 39.0–52.0)
Hemoglobin: 12.3 g/dL — ABNORMAL LOW (ref 13.0–17.0)
MCH: 31.1 pg (ref 26.0–34.0)
MCHC: 34 g/dL (ref 30.0–36.0)
MCV: 91.4 fL (ref 78.0–100.0)
PLATELETS: 121 10*3/uL — AB (ref 150–400)
RBC: 3.96 MIL/uL — ABNORMAL LOW (ref 4.22–5.81)
RDW: 13.1 % (ref 11.5–15.5)
WBC: 10.5 10*3/uL (ref 4.0–10.5)

## 2015-10-06 LAB — BASIC METABOLIC PANEL
Anion gap: 8 (ref 5–15)
BUN: 8 mg/dL (ref 6–20)
CALCIUM: 8.6 mg/dL — AB (ref 8.9–10.3)
CO2: 28 mmol/L (ref 22–32)
CREATININE: 0.65 mg/dL (ref 0.61–1.24)
Chloride: 99 mmol/L — ABNORMAL LOW (ref 101–111)
Glucose, Bld: 156 mg/dL — ABNORMAL HIGH (ref 65–99)
Potassium: 4.6 mmol/L (ref 3.5–5.1)
Sodium: 135 mmol/L (ref 135–145)

## 2015-10-06 MED ORDER — TIOTROPIUM BROMIDE MONOHYDRATE 18 MCG IN CAPS
18.0000 ug | ORAL_CAPSULE | Freq: Every day | RESPIRATORY_TRACT | Status: DC
  Administered 2015-10-06 – 2015-10-07 (×2): 18 ug via RESPIRATORY_TRACT
  Filled 2015-10-06: qty 5

## 2015-10-06 MED ORDER — ARFORMOTEROL TARTRATE 15 MCG/2ML IN NEBU
15.0000 ug | INHALATION_SOLUTION | Freq: Two times a day (BID) | RESPIRATORY_TRACT | Status: DC
  Administered 2015-10-06 (×2): 15 ug via RESPIRATORY_TRACT
  Filled 2015-10-06 (×4): qty 2

## 2015-10-06 NOTE — Evaluation (Signed)
Occupational Therapy Evaluation Patient Details Name: [name] MRN: [id number] DOB: 22-Oct-1932 Today's Date: 10/06/2015    History of Present Illness Admitted for RTKA;  has a past medical history of Pneumonia (08/2011); Myocardial infarction (HCC) (02/23/2009); Coronary artery disease; Diabetes mellitus; Arthritis; Anemia; Stroke (HCC); Carotid artery stenosis; Complication of anesthesia; and Hypertension.  has past surgical history that includes Hand surgery; Rotator cuff repair; Carpal tunnel release; Joint replacement; Coronary artery bypass graft (2010); Back surgery (8295,[id number], 2011, 03/2011); Total hip arthroplasty (06/18/2012); Total hip arthroplasty (06/18/2012); Injection knee (06/18/2012); Eye surgery (Bilateral); Back surgery (01/2015); and Hand surgery (Left).   Clinical Impression   Patient presenting with decreased ADL and functional mobility independence secondary to above. Patient independent PTA. Patient currently functioning at an overall min to max assist level. Patient will benefit from acute OT to increase overall independence in the areas of ADLs, functional mobility, and overall safety in order to safely discharge home vs SNF, depending on progress.   2-4 L/min supplemental 02 utilized during session. Sats ranged from 80-93%, cues needed for pursed lip breathing. Encouragement needed for seated rest break. Pt left on 3L/min supplemental 02 at end of session. Pt unable to maintain 90% on 2 L.    Follow Up Recommendations  Home health OT;Supervision/Assistance - 24 hour (may need SNF depending on progress)    Equipment Recommendations  3 in 1 bedside comode    Recommendations for Other Services  None at this time   Precautions / Restrictions Precautions Precautions: Knee;Fall Required Braces or Orthoses: Knee Immobilizer - Right Knee Immobilizer - Right:  (no order for KI, but in room and pt unable to complete a straight leg raise) Restrictions Weight Bearing  Restrictions: Yes RLE Weight Bearing: Weight bearing as tolerated    Mobility Bed Mobility Overal bed mobility: Needs Assistance Bed Mobility: Supine to Sit     Supine to sit: Mod assist     General bed mobility comments: Mod assist for trunk control into sitting. Assistance needed for management of RLE. Cues for technique and safety.   Transfers Overall transfer level: Needs assistance Equipment used: Rolling walker (2 wheeled) Transfers: Sit to/from [employer] Sit to Stand: Mod assist Stand pivot transfers: Mod assist       General transfer comment: Cues for hand placement and safety; mod assist to initiate boost and to assist with short distance transfer to recliner    Balance Overall balance assessment: Needs assistance Sitting-balance support: Feet supported;Bilateral upper extremity supported Sitting balance-Leahy Scale: Poor Sitting balance - Comments: Pt leaning on walker and had a difficult time holding self upright without UE support   Standing balance support: Bilateral upper extremity supported;During functional activity Standing balance-Leahy Scale: Poor    ADL Overall ADL's : Needs assistance/impaired Eating/Feeding: Set up;Sitting   Grooming: Set up;Sitting   Upper Body Bathing: Minimal assitance;Sitting   Lower Body Bathing: Moderate assistance;Sit to/from stand   Upper Body Dressing : Minimal assistance;Sitting   Lower Body Dressing: Maximal assistance;Sit to/from stand   Toilet Transfer: Minimal assistance;Moderate assistance;RW Toilet Transfer Details (indicate cue type and reason): simulated EOB to recliner transfer         Functional mobility during ADLs: Moderate assistance;Rolling walker;Cueing for sequencing;Cueing for safety;Minimal assistance General ADL Comments: Pt with decreased safety awareness and takes increased time to follow commands. During transfer EOB to recliner, pt took increased time and would hunch over  RW. Therapist encouraged pt to stand up straight and pt kept saying he couldn't, just stopping  and resting elbow on RW. This was very unsafe.     Pertinent Vitals/Pain Pain Assessment: Faces Faces Pain Scale: Hurts even more Pain Location: right knee with movement and mobility  Pain Descriptors / Indicators: Aching;Sore;Guarding Pain Intervention(s): Limited activity within patient's tolerance;Monitored during session;Repositioned     Hand Dominance Right   Extremity/Trunk Assessment Upper Extremity Assessment Upper Extremity Assessment: Generalized weakness   Lower Extremity Assessment Lower Extremity Assessment: Defer to PT evaluation   Cervical / Trunk Assessment Cervical / Trunk Assessment: Normal   Communication Communication Communication: No difficulties   Cognition Arousal/Alertness: Awake/alert Behavior During Therapy: WFL for tasks assessed/performed;Impulsive Overall Cognitive Status: Within Functional Limits for tasks assessed (decreased safety awareness and increased time needed for following commands)               Home Living Family/patient expects to be discharged to:: Private residence Living Arrangements: Spouse/significant other Available Help at Discharge: Family Type of Home: House Home Access: Ramped entrance     Home Layout: One level     Bathroom Shower/Tub: Arts development officer Toilet: Handicapped height     Home Equipment: Environmental consultant - 2 wheels;Shower seat - built in   Prior Functioning/Environment Level of Independence: Independent with assistive device(s)     OT Diagnosis: Generalized weakness;Acute pain   OT Problem List: Decreased strength;Decreased activity tolerance;Impaired balance (sitting and/or standing);Decreased safety awareness;Decreased knowledge of use of DME or AE;Cardiopulmonary status limiting activity;Pain   OT Treatment/Interventions: Self-care/ADL training;Energy conservation;DME and/or AE  instruction;Therapeutic activities;Patient/family education;Balance training    OT Goals(Current goals can be found in the care plan section) Acute Rehab OT Goals Patient Stated Goal: go home OT Goal Formulation: With patient Time For Goal Achievement: 10/20/15 Potential to Achieve Goals: Good ADL Goals Pt Will Perform Grooming: with supervision;standing Pt Will Perform Lower Body Bathing: with supervision;sit to/from stand Pt Will Perform Lower Body Dressing: with supervision;sit to/from stand Pt Will Transfer to Toilet: with supervision;bedside commode;ambulating Pt Will Perform Tub/Shower Transfer: Shower transfer;ambulating;shower seat;rolling walker;with supervision Additional ADL Goal #1: Pt will be supervision for bed mobility as a precursor for ADLs  OT Frequency: Min 2X/week   Barriers to D/C: Decreased caregiver support  Pt lives with 60 yo wife   End of Session Equipment Utilized During Treatment: Rolling walker;Oxygen CPM Right Knee CPM Right Knee: Off Nurse Communication: Mobility status  Activity Tolerance: Patient limited by lethargy Patient left: in chair;with call bell/phone within reach;with chair alarm set   Time: 1610-9604 OT Time Calculation (min): 20 min Charges:  OT General Charges $OT Visit: 1 Procedure OT Evaluation $OT Eval Moderate Complexity: 1 Procedure  Edwin Cap , MS, OTR/L, CLT Pager: [phone]  10/06/2015, 9:53 AM

## 2015-10-06 NOTE — Progress Notes (Addendum)
Pt on 3L of O2 at this time saturations are ranging 90-94%. Continue to attempt to wean off of O2. Will continue to monitor.

## 2015-10-06 NOTE — Progress Notes (Signed)
Physical Therapy Progress Note    10/06/15 1717  PT Visit Information  Last PT Received On 10/06/15  Assistance Needed +1  History of Present Illness Admitted for RTKA;  has a past medical history of Pneumonia (08/2011); Myocardial infarction (HCC) (02/23/2009); Coronary artery disease; Diabetes mellitus; Arthritis; Anemia; Stroke (HCC); Carotid artery stenosis; Complication of anesthesia; and Hypertension.  has past surgical history that includes Hand surgery; Rotator cuff repair; Carpal tunnel release; Joint replacement; Coronary artery bypass graft (2010); Back surgery (0981,1914, 2011, 03/2011); Total hip arthroplasty (06/18/2012); Total hip arthroplasty (06/18/2012); Injection knee (06/18/2012); Eye surgery (Bilateral); Back surgery (01/2015); and Hand surgery (Left).  PT Time Calculation  PT Start Time (ACUTE ONLY) 1642  PT Stop Time (ACUTE ONLY) 1704  PT Time Calculation (min) (ACUTE ONLY) 22 min  Subjective Data  Subjective Patient just returned to bed with nursing.  Precautions  Precautions Knee;Fall  Required Braces or Orthoses Knee Immobilizer - Right  Knee Immobilizer - Right On when out of bed or walking (No orders, but KI in room)  Restrictions  Weight Bearing Restrictions Yes  RLE Weight Bearing WBAT  Pain Assessment  Pain Assessment Faces  Faces Pain Scale 6  Pain Location Rt knee  Pain Descriptors / Indicators Aching  Pain Intervention(s) Limited activity within patient's tolerance;Monitored during session;Repositioned  Cognition  Arousal/Alertness Awake/alert  Behavior During Therapy WFL for tasks assessed/performed;Impulsive  Overall Cognitive Status Within Functional Limits for tasks assessed  Exercises  Exercises Total Joint  Total Joint Exercises  Ankle Circles/Pumps AROM;Both;10 reps;Supine  Quad Sets AROM;Right;10 reps;Supine (with foam under foot)  Towel Squeeze AROM;Both;10 reps;Supine  Short Arc Quad AAROM;Right;5 reps;Supine  Heel Slides AAROM;Right;10  reps;Supine  Hip ABduction/ADduction AAROM;Right;10 reps;Supine  Goniometric ROM -10* ext  PT - End of Session  Equipment Utilized During Treatment Oxygen  Activity Tolerance Patient tolerated treatment well;Patient limited by pain  Patient left in bed;with call bell/phone within reach  PT - Assessment/Plan  PT Plan Current plan remains appropriate  PT Frequency (ACUTE ONLY) 7X/week  Follow Up Recommendations Home health PT;Supervision/Assistance - 24 hour  PT equipment Rolling walker with 5" wheels;3in1 (PT)  PT Goal Progression  Progress towards PT goals Progressing toward goals  PT General Charges  $$ ACUTE PT VISIT 1 Procedure  PT Treatments  $Therapeutic Exercise 8-22 mins  Durenda Hurt. Renaldo Fiddler, [facility] Acute Rehab Services Pager [phone]

## 2015-10-06 NOTE — Progress Notes (Addendum)
Subjective: 1 Day Post-Op Procedure(s) (LRB): RIGHT TOTAL KNEE ARTHROPLASTY (Right) Patient reports pain as mild.  Patient reports one episode of vomiting last night.  No nausea/vomiting since.  No chest pain/sob, lightheadedness/dizziness.  Only issue is low O2 sat despite being on 4L oxygen  Objective: Vital signs in last 24 hours: Temp:  [97.6 F (36.4 C)-98.6 F (37 C)] 98.3 F (36.8 C) (02/02 0627) Pulse Rate:  [44-65] 62 (02/02 0627) Resp:  [0-17] 16 (02/02 0627) BP: (142-217)/(56-80) 153/58 mmHg (02/02 0627) SpO2:  [90 %-98 %] 93 % (02/02 0627) Weight:  [95.709 kg (211 lb)] 95.709 kg (211 lb) (02/01 0945)  Intake/Output from previous day: 02/01 0701 - 02/02 0700 In: 1690 [P.O.:240; I.V.:1400; IV Piggyback:50] Out: 770 [Urine:750; Blood:20] Intake/Output this shift: Total I/O In: 240 [P.O.:240] Out: 550 [Urine:550]   Recent Labs  10/05/15 1603 10/06/15 0513  HGB 14.2 12.3*    Recent Labs  10/05/15 1603 10/06/15 0513  WBC 11.4* 10.5  RBC 4.58 3.96*  HCT 41.8 36.2*  PLT 138* 121*    Recent Labs  10/05/15 1603 10/06/15 0513  NA  --  135  K  --  4.6  CL  --  99*  CO2  --  28  BUN  --  8  CREATININE 0.75 0.65  GLUCOSE  --  156*  CALCIUM  --  8.6*   No results for input(s): LABPT, INR in the last 72 hours.  Neurologically intact Neurovascular intact Sensation intact distally Intact pulses distally Dorsiflexion/Plantar flexion intact Compartment soft  Negative homans bilatearlly  Assessment/Plan: 1 Day Post-Op Procedure(s) (LRB): RIGHT TOTAL KNEE ARTHROPLASTY (Right) Advance diet Up with therapy D/C IV fluids  WBAT RLE ABLA-mild and stable Ordered new meds for low O2 sat D/C today only if his O2 sat is stable without oxygen and if he continues to progress with PT Dry dressing change prn  Otilio Saber 10/06/2015, 6:59 AM

## 2015-10-06 NOTE — Progress Notes (Addendum)
Physical Therapy Treatment Patient Details Name: Jos Cygan MRN: [id number] DOB: 1932-11-27 Today's Date: 10/06/2015    History of Present Illness Admitted for RTKA;  has a past medical history of Pneumonia (08/2011); Myocardial infarction (HCC) (02/23/2009); Coronary artery disease; Diabetes mellitus; Arthritis; Anemia; Stroke (HCC); Carotid artery stenosis; Complication of anesthesia; and Hypertension.  has past surgical history that includes Hand surgery; Rotator cuff repair; Carpal tunnel release; Joint replacement; Coronary artery bypass graft (2010); Back surgery (4098,1191, 2011, 03/2011); Total hip arthroplasty (06/18/2012); Total hip arthroplasty (06/18/2012); Injection knee (06/18/2012); Eye surgery (Bilateral); Back surgery (01/2015); and Hand surgery (Left).    PT Comments    Patient making improvement with mobility and gait.  However, O2 sats dropped into 80's with gait on room air.  O2 reapplied and sats returned to 91%.  Encouraged patient and wife to use w/c to get from car into home (via ramp).  Patient to have HHPT f/u at discharge.   Follow Up Recommendations  Home health PT;Supervision/Assistance - 24 hour     Equipment Recommendations  Rolling walker with 5" wheels;3in1 (PT)    Recommendations for Other Services       Precautions / Restrictions Precautions Precautions: Knee;Fall Precaution Comments: Reviewed precautions with patient Required Braces or Orthoses: Knee Immobilizer - Right Knee Immobilizer - Right: On when out of bed or walking (No orders, but KI in room) Restrictions Weight Bearing Restrictions: Yes RLE Weight Bearing: Weight bearing as tolerated    Mobility  Bed Mobility               General bed mobility comments: Patient OOB in chair  Transfers Overall transfer level: Needs assistance Equipment used: Rolling walker (2 wheeled) Transfers: Sit to/from Stand Sit to Stand: Min guard         General transfer comment: Verbal cues  for hand placement and technique.  Close assist for safety/balance.  Ambulation/Gait Ambulation/Gait assistance: Min guard Ambulation Distance (Feet): 52 Feet (12' and 40') Assistive device: Rolling walker (2 wheeled) Gait Pattern/deviations: Step-through pattern;Decreased stance time - right;Decreased step length - left;Decreased step length - right;Decreased stride length;Decreased weight shift to right;Shuffle;Antalgic;Trunk flexed Gait velocity: decreased Gait velocity interpretation: Below normal speed for age/gender General Gait Details: Verbal cues for safe use of RW and gait sequence.  Cues to stand upright - patient with flexed posture.  Ambulated 12' and needed urinal.  Needed sitting rest break.  Patient then ambulated 22' with 3 standing rest breaks.  Slow antalgic gait.   Stairs            Wheelchair Mobility    Modified Rankin (Stroke Patients Only)       Balance           Standing balance support: Bilateral upper extremity supported Standing balance-Leahy Scale: Poor                      Cognition Arousal/Alertness: Awake/alert Behavior During Therapy: WFL for tasks assessed/performed;Impulsive Overall Cognitive Status: Within Functional Limits for tasks assessed (decreased safety awareness and increased time needed for fol)                      Exercises Total Joint Exercises Ankle Circles/Pumps: AROM;Both;10 reps;Seated Quad Sets: AROM;Right;5 reps;Seated Heel Slides: AAROM;Right;5 reps;Seated Hip ABduction/ADduction: AAROM;Right;5 reps;Seated    General Comments General comments (skin integrity, edema, etc.): Patient ambulated on room air.  Ox sats decreased to low 80's.  O2 reapplied and O2 sats increased to 91%.  Pertinent Vitals/Pain Pain Assessment: Faces Faces Pain Scale: Hurts little more Pain Location: Rt knee Pain Descriptors / Indicators: Aching;Sore Pain Intervention(s): Limited activity within patient's  tolerance;Monitored during session;Repositioned    Home Living                      Prior Function            PT Goals (current goals can now be found in the care plan section) Progress towards PT goals: Progressing toward goals    Frequency  7X/week    PT Plan Current plan remains appropriate    Co-evaluation             End of Session Equipment Utilized During Treatment: Gait belt;Right knee immobilizer;Oxygen Activity Tolerance: Patient tolerated treatment well;Patient limited by pain Patient left: in chair;with call bell/phone within reach;with chair alarm set;with family/visitor present     Time: 1410-1448 PT Time Calculation (min) (ACUTE ONLY): 38 min  Charges:  $Gait Training: 23-37 mins $Therapeutic Exercise: 8-22 mins                    G Codes:      Vena Austria October 27, 2015, 3:59 PM Durenda Hurt. Renaldo Fiddler, [facility] Acute Rehab Services Pager [phone]

## 2015-10-07 LAB — BASIC METABOLIC PANEL
Anion gap: 9 (ref 5–15)
BUN: 13 mg/dL (ref 6–20)
CHLORIDE: 96 mmol/L — AB (ref 101–111)
CO2: 30 mmol/L (ref 22–32)
CREATININE: 0.82 mg/dL (ref 0.61–1.24)
Calcium: 9.1 mg/dL (ref 8.9–10.3)
Glucose, Bld: 207 mg/dL — ABNORMAL HIGH (ref 65–99)
POTASSIUM: 4.7 mmol/L (ref 3.5–5.1)
SODIUM: 135 mmol/L (ref 135–145)

## 2015-10-07 LAB — CBC
HEMATOCRIT: 34.7 % — AB (ref 39.0–52.0)
HEMOGLOBIN: 11.9 g/dL — AB (ref 13.0–17.0)
MCH: 31.3 pg (ref 26.0–34.0)
MCHC: 34.3 g/dL (ref 30.0–36.0)
MCV: 91.3 fL (ref 78.0–100.0)
Platelets: 108 10*3/uL — ABNORMAL LOW (ref 150–400)
RBC: 3.8 MIL/uL — ABNORMAL LOW (ref 4.22–5.81)
RDW: 12.8 % (ref 11.5–15.5)
WBC: 12.3 10*3/uL — AB (ref 4.0–10.5)

## 2015-10-07 NOTE — Progress Notes (Signed)
Subjective: 2 Days Post-Op Procedure(s) (LRB): RIGHT TOTAL KNEE ARTHROPLASTY (Right) Patient reports pain as mild.  No nausea/vomiting, lightheadedness/dizziness, chest pain/sob.  Positive flatus but no bm.  Tolerating diet.  Objective: Vital signs in last 24 hours: Temp:  [97.8 F (36.6 C)-98.9 F (37.2 C)] 97.8 F (36.6 C) (02/03 0501) Pulse Rate:  [64-71] 64 (02/03 0501) Resp:  [16-18] 18 (02/03 0501) BP: (147-171)/(54-68) 147/54 mmHg (02/03 0501) SpO2:  [92 %-94 %] 92 % (02/03 0501)  Intake/Output from previous day: 02/02 0701 - 02/03 0700 In: 720 [P.O.:720] Out: 1300 [Urine:1300] Intake/Output this shift:     Recent Labs  10/05/15 1603 10/06/15 0513 10/07/15 0447  HGB 14.2 12.3* 11.9*    Recent Labs  10/06/15 0513 10/07/15 0447  WBC 10.5 12.3*  RBC 3.96* 3.80*  HCT 36.2* 34.7*  PLT 121* 108*    Recent Labs  10/06/15 0513 10/07/15 0447  NA 135 135  K 4.6 4.7  CL 99* 96*  CO2 28 30  BUN 8 13  CREATININE 0.65 0.82  GLUCOSE 156* 207*  CALCIUM 8.6* 9.1   No results for input(s): LABPT, INR in the last 72 hours.  Neurologically intact Neurovascular intact Sensation intact distally Intact pulses distally Dorsiflexion/Plantar flexion intact Incision: dressing C/D/I No cellulitis present Compartment soft  Dressing changed by me today  Assessment/Plan: 2 Days Post-Op Procedure(s) (LRB): RIGHT TOTAL KNEE ARTHROPLASTY (Right) Advance diet Up with therapy Discharge home with home health today WBAT RLE Dry dressing change prn ABLA-mild and stable Decreased O2 sat likely due to chronic partially collapsed lung and I believe this is his baseline  Otilio Saber 10/07/2015, 8:10 AM

## 2015-10-07 NOTE — Progress Notes (Signed)
[name] to be D/C'd Home per MD order. Discussed with the patient and all questions fully answered.    Medication List    STOP taking these medications        aspirin EC 81 MG tablet     HYDROcodone-acetaminophen 5-325 MG tablet  Commonly known as:  NORCO/VICODIN     methocarbamol 500 MG tablet  Commonly known as:  ROBAXIN     oxyCODONE 5 MG immediate release tablet  Commonly known as:  Oxy IR/ROXICODONE      TAKE these medications        amLODipine 10 MG tablet  Commonly known as:  NORVASC  Take 10 mg by mouth daily.     atorvastatin 80 MG tablet  Commonly known as:  LIPITOR  Take 80 mg by mouth every evening.     bisacodyl 5 MG EC tablet  Commonly known as:  DULCOLAX  Take 1 tablet (5 mg total) by mouth daily as needed for moderate constipation.     carvedilol 3.125 MG tablet  Commonly known as:  COREG  Take 3.125 mg by mouth 2 (two) times daily with a meal.     enoxaparin 30 MG/0.3ML injection  Commonly known as:  LOVENOX  Inject 0.3 mLs (30 mg total) into the skin every 12 (twelve) hours. Use Lovenox injections as directed q12 hours x 14 days following surgery to prevent blood clots     etodolac 400 MG tablet  Commonly known as:  LODINE  Take 400 mg by mouth 2 (two) times daily as needed for moderate pain.     ferrous sulfate 325 (65 FE) MG tablet  Take 325 mg by mouth every evening.     furosemide 40 MG tablet  Commonly known as:  LASIX  Take 40 mg by mouth daily.     ondansetron 4 MG tablet  Commonly known as:  ZOFRAN  Take 1 tablet (4 mg total) by mouth every 8 (eight) hours as needed for nausea or vomiting.     oxyCODONE-acetaminophen 5-325 MG tablet  Commonly known as:  ROXICET  Take 1-2 tablets by mouth every 4 (four) hours as needed.     STIOLTO RESPIMAT 2.5-2.5 MCG/ACT Aers  Generic drug:  Tiotropium Bromide-Olodaterol  Inhale 1-2 puffs into the lungs daily as needed (for wheezing or shortness of breath).     triamcinolone cream 0.1  %  Commonly known as:  KENALOG  Apply 1 application topically daily as needed (for itching).     vitamin B-12 1000 MCG tablet  Commonly known as:  CYANOCOBALAMIN  Take 1,000 mcg by mouth daily.        VVS, Skin clean, dry and intact without evidence of skin break down, no evidence of skin tears noted.  IV catheter discontinued intact. Site without signs and symptoms of complications. Dressing and pressure applied.  An After Visit Summary was printed and given to the patient.  Patient escorted via WC, and D/C home via private auto.  [name]  10/07/2015 6:58 PM

## 2015-10-07 NOTE — Care Management Important Message (Signed)
Important Message  Patient Details  Name: [name] MRN: [id number] Date of Birth: 1932/10/17   Medicare Important Message Given:  Yes    Dmitri Pettigrew P Kayanna Mckillop 10/07/2015, 4:15 PM

## 2015-10-07 NOTE — Progress Notes (Signed)
O2 sats on room air range from 91-94%.

## 2015-10-07 NOTE — Progress Notes (Signed)
Physical Therapy Treatment Patient Details Name: [name] MRN: [id number] DOB: 12-08-1932 Today's Date: 10/07/2015    History of Present Illness Admitted for RTKA;  has a past medical history of Pneumonia (08/2011); Myocardial infarction (HCC) (02/23/2009); Coronary artery disease; Diabetes mellitus; Arthritis; Anemia; Stroke (HCC); Carotid artery stenosis; Complication of anesthesia; and Hypertension.  has past surgical history that includes Hand surgery; Rotator cuff repair; Carpal tunnel release; Joint replacement; Coronary artery bypass graft (2010); Back surgery (2130,8657, 2011, 03/2011); Total hip arthroplasty (06/18/2012); Total hip arthroplasty (06/18/2012); Injection knee (06/18/2012); Eye surgery (Bilateral); Back surgery (01/2015); and Hand surgery (Left).    PT Comments    Patient making progress with mobility and gait.  Able to ambulate 26' with RW and min guard assist today.  Continue to recommend patient move from car into house with his w/c using ramp.  Patient to have f/u HHPT at discharge.  Ready for d/c from PT perspective.   Follow Up Recommendations  Home health PT;Supervision/Assistance - 24 hour     Equipment Recommendations  Rolling walker with 5" wheels;3in1 (PT)    Recommendations for Other Services       Precautions / Restrictions Precautions Precautions: Knee;Fall Precaution Comments: Reviewed precautions with patient Required Braces or Orthoses: Knee Immobilizer - Right Knee Immobilizer - Right: On when out of bed or walking Restrictions Weight Bearing Restrictions: Yes RLE Weight Bearing: Weight bearing as tolerated    Mobility  Bed Mobility Overal bed mobility: Needs Assistance Bed Mobility: Rolling;Sidelying to Sit Rolling: Min guard Sidelying to sit: Min guard       General bed mobility comments: Patient OOB in chair  Transfers Overall transfer level: Needs assistance Equipment used: Rolling walker (2 wheeled) Transfers: Sit to/from  Stand Sit to Stand: Min guard Stand pivot transfers: Min guard       General transfer comment: Verbal cues for hand placement and technique.  Close assist for safety/balance.  Ambulation/Gait Ambulation/Gait assistance: Min guard Ambulation Distance (Feet): 86 Feet Assistive device: Rolling walker (2 wheeled) Gait Pattern/deviations: Step-through pattern;Decreased stance time - right;Decreased step length - left;Decreased stride length;Antalgic;Decreased weight shift to right;Trunk flexed Gait velocity: decreased Gait velocity interpretation: Below normal speed for age/gender General Gait Details: Verbal cues to stand upright and stay close to RW.  Fatigues quickly.  O2 sats at 90% on room air following gait.   Stairs            Wheelchair Mobility    Modified Rankin (Stroke Patients Only)       Balance                                    Cognition Arousal/Alertness: Awake/alert Behavior During Therapy: WFL for tasks assessed/performed Overall Cognitive Status: Within Functional Limits for tasks assessed                      Exercises Total Joint Exercises Ankle Circles/Pumps: AROM;Both;10 reps;Seated Quad Sets: AROM;Right;10 reps;Seated Short Arc Quad: AROM;Right;5 reps;Seated Heel Slides: AAROM;Right;5 reps;Seated Hip ABduction/ADduction: AAROM;Right;10 reps;Seated Long Arc Quad: AROM;Right;10 reps;Seated Knee Flexion: AROM;Right;10 reps;Seated Goniometric ROM: -10 ext; 60* flex    General Comments        Pertinent Vitals/Pain Pain Assessment: Faces Faces Pain Scale: Hurts a little bit Pain Location: Rt knee with activity Pain Descriptors / Indicators: Sore;Tightness Pain Intervention(s): Monitored during session;Repositioned    Home Living  Prior Function            PT Goals (current goals can now be found in the care plan section) Progress towards PT goals: Progressing toward goals     Frequency  7X/week    PT Plan Current plan remains appropriate    Co-evaluation             End of Session Equipment Utilized During Treatment: Gait belt;Right knee immobilizer Activity Tolerance: Patient tolerated treatment well;Patient limited by fatigue Patient left: in chair;with call bell/phone within reach     Time: 1051-1114 PT Time Calculation (min) (ACUTE ONLY): 23 min  Charges:  $Gait Training: 8-22 mins $Therapeutic Exercise: 8-22 mins                    G Codes:      Vena Austria 10/30/15, 1:42 PM Durenda Hurt. Renaldo Fiddler, [facility] Acute Rehab Services Pager [phone]

## 2015-10-07 NOTE — Progress Notes (Signed)
Occupational Therapy Treatment Patient Details Name: Zed Wanninger MRN: [id number] DOB: 07/16/1933 Today's Date: 10/07/2015    History of present illness Admitted for RTKA;  has a past medical history of Pneumonia (08/2011); Myocardial infarction (HCC) (02/23/2009); Coronary artery disease; Diabetes mellitus; Arthritis; Anemia; Stroke (HCC); Carotid artery stenosis; Complication of anesthesia; and Hypertension.  has past surgical history that includes Hand surgery; Rotator cuff repair; Carpal tunnel release; Joint replacement; Coronary artery bypass graft (2010); Back surgery (1478,2956, 2011, 03/2011); Total hip arthroplasty (06/18/2012); Total hip arthroplasty (06/18/2012); Injection knee (06/18/2012); Eye surgery (Bilateral); Back surgery (01/2015); and Hand surgery (Left).   OT comments  Pt. Progressing well with OT goals.  Able to complete bed mobility and toileting tasks.  Note d/c likely later today.  Follow Up Recommendations  Home health OT;Supervision/Assistance - 24 hour    Equipment Recommendations  3 in 1 bedside comode    Recommendations for Other Services      Precautions / Restrictions Precautions Precautions: Knee;Fall Required Braces or Orthoses: Knee Immobilizer - Right Knee Immobilizer - Right: On when out of bed or walking Restrictions RLE Weight Bearing: Weight bearing as tolerated       Mobility Bed Mobility Overal bed mobility: Needs Assistance Bed Mobility: Rolling;Sidelying to Sit Rolling: Min guard Sidelying to sit: Min guard       General bed mobility comments: hob flat, no rail, no use of trapeze to simulate home env., cues for log roll  Transfers Overall transfer level: Needs assistance Equipment used: Rolling walker (2 wheeled) Transfers: Sit to/from [employer] Sit to Stand: Min guard Stand pivot transfers: Min guard       General transfer comment: Verbal cues for hand placement and technique.  Close assist for  safety/balance.    Balance                                   ADL       Grooming: Set up;Sitting                   Toilet Transfer: Min guard;RW   Toileting- Clothing Manipulation and Hygiene: Min guard;Sit to/from stand       Functional mobility during ADLs: Min guard;Cueing for safety;Cueing for sequencing;Rolling walker General ADL Comments: improvement with mobility, no complaints/concerns, cues to remain upright      Vision                     Perception     Praxis      Cognition   Behavior During Therapy: [facility] for tasks assessed/performed Overall Cognitive Status: Within Functional Limits for tasks assessed                       Extremity/Trunk Assessment               Exercises     Shoulder Instructions       General Comments      Pertinent Vitals/ Pain       Pain Assessment: No/denies pain  Home Living                                          Prior Functioning/Environment              Frequency Min 2X/week  Progress Toward Goals  OT Goals(current goals can now be found in the care plan section)  Progress towards OT goals: Progressing toward goals     Plan Discharge plan remains appropriate    Co-evaluation                 End of Session Equipment Utilized During Treatment: Gait belt;Rolling walker   Activity Tolerance Patient tolerated treatment well   Patient Left Other (comment) (left amb. with PT)   Nurse Communication          Time: 1610-9604 OT Time Calculation (min): 20 min  Charges: OT General Charges $OT Visit: 1 Procedure OT Treatments $Self Care/Home Management : 8-22 mins  Robet Leu, COTA/L 10/07/2015, 11:10 AM

## 2015-10-10 ENCOUNTER — Encounter (HOSPITAL_COMMUNITY): Payer: Self-pay | Admitting: Orthopedic Surgery

## 2015-10-10 NOTE — Anesthesia Postprocedure Evaluation (Signed)
Anesthesia Post Note  Patient: [name]  Procedure(s) Performed: Procedure(s) (LRB): RIGHT TOTAL KNEE ARTHROPLASTY (Right)  Patient location during evaluation: PACU Anesthesia Type: MAC and Spinal Level of consciousness: awake Pain management: pain level controlled Vital Signs Assessment: post-procedure vital signs reviewed and stable Respiratory status: spontaneous breathing Cardiovascular status: stable Postop Assessment: patient able to bend at knees and no signs of nausea or vomiting Anesthetic complications: no    Last Vitals:  Filed Vitals:   10/06/15 2007 10/07/15 0501  BP: 171/61 147/54  Pulse: 66 64  Temp: 37.2 C 36.6 C  Resp: 16 18    Last Pain:  Filed Vitals:   10/07/15 0504  PainSc: Asleep                 Addelynn Batte

## 2016-08-07 NOTE — H&P (Deleted)
[name] comes in with a new problem.  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  Longstanding issues bunion first MP joint, right foot.  Getting progressively worse.  Does not like the extreme of motion.  Altering what she can wear foot wear wise.  No numbness.  No tingling.  No symptoms opposite foot.   History and general exam are reviewed.   EXAMINATION: Specifically, healthy 82 year-old male.  Height: 5?10.  Weight: 150 pounds.  Normal ligamentous laxity.  Normal gait and stance.  Right foot has hallux valgus without significant metatarsus prima vara.  Sore medially.  A little over her sesamoids, but most of this is medial.  Neurovascularly intact.  Hallux valgus is moderately correctable.  No grating.  No crepitus.  No hallux rigidus.  No symptoms opposite left foot.    X-RAYS: Three view x-rays on the right with comparison, AP both feet, completed.  No degenerative changes.  She does have a bipartite sesamoid, very small over the lateral sesamoid, both feet.  There is also what appears to be a loose body or old bony injury at the base of the proximal phalanx laterally.  This is on the left asymptomatic side.  First-second intermetatarsal angle 9.4 on the left and 11.6 on the right.  Not significant metatarsus prima vara.    IMPRESSION: Worsening symptoms of hallux valgus, right foot.    PLAN: We have discussed definitive treatment.  Intermetatarsal angle not bad.  We have talked about [employer] corrective osteotomy.  Procedure, risks, benefits and complications reviewed.  Paperwork complete.  All questions answered.  What to expect post-op reviewed.  She is going to try to arrange this over Christmas break.  See her at the time of operative intervention.

## 2016-08-14 ENCOUNTER — Encounter (HOSPITAL_BASED_OUTPATIENT_CLINIC_OR_DEPARTMENT_OTHER): Payer: Self-pay | Admitting: *Deleted

## 2016-08-14 NOTE — Progress Notes (Signed)
Chart reviewed by Drs Malen GauzeFoster and Noreene LarssonJoslin, [place] for [facility].

## 2016-08-16 ENCOUNTER — Encounter (HOSPITAL_BASED_OUTPATIENT_CLINIC_OR_DEPARTMENT_OTHER)
Admission: RE | Disposition: A | Discharge status: Home / Self Care | Payer: Self-pay | Source: Ambulatory Visit | Attending: Orthopedic Surgery

## 2016-08-16 ENCOUNTER — Ambulatory Visit (HOSPITAL_BASED_OUTPATIENT_CLINIC_OR_DEPARTMENT_OTHER)
Admission: RE | Admit: 2016-08-16 | Discharge: 2016-08-16 | Disposition: A | Discharge status: Home / Self Care | Payer: Medicare HMO | Source: Ambulatory Visit | Attending: Orthopedic Surgery | Admitting: Orthopedic Surgery

## 2016-08-16 ENCOUNTER — Encounter (HOSPITAL_BASED_OUTPATIENT_CLINIC_OR_DEPARTMENT_OTHER): Payer: Self-pay | Admitting: Anesthesiology

## 2016-08-16 ENCOUNTER — Ambulatory Visit (HOSPITAL_BASED_OUTPATIENT_CLINIC_OR_DEPARTMENT_OTHER): Payer: Medicare HMO | Admitting: Anesthesiology

## 2016-08-16 DIAGNOSIS — E119 Type 2 diabetes mellitus without complications: Secondary | ICD-10-CM | POA: Diagnosis not present

## 2016-08-16 DIAGNOSIS — Z87891 Personal history of nicotine dependence: Secondary | ICD-10-CM | POA: Insufficient documentation

## 2016-08-16 DIAGNOSIS — I252 Old myocardial infarction: Secondary | ICD-10-CM | POA: Diagnosis not present

## 2016-08-16 DIAGNOSIS — Z7982 Long term (current) use of aspirin: Secondary | ICD-10-CM | POA: Diagnosis not present

## 2016-08-16 DIAGNOSIS — Z8673 Personal history of transient ischemic attack (TIA), and cerebral infarction without residual deficits: Secondary | ICD-10-CM | POA: Insufficient documentation

## 2016-08-16 DIAGNOSIS — M654 Radial styloid tenosynovitis [de Quervain]: Secondary | ICD-10-CM | POA: Diagnosis present

## 2016-08-16 DIAGNOSIS — Z79899 Other long term (current) drug therapy: Secondary | ICD-10-CM | POA: Insufficient documentation

## 2016-08-16 DIAGNOSIS — I10 Essential (primary) hypertension: Secondary | ICD-10-CM | POA: Insufficient documentation

## 2016-08-16 DIAGNOSIS — Z951 Presence of aortocoronary bypass graft: Secondary | ICD-10-CM | POA: Insufficient documentation

## 2016-08-16 DIAGNOSIS — E669 Obesity, unspecified: Secondary | ICD-10-CM | POA: Diagnosis not present

## 2016-08-16 DIAGNOSIS — I2510 Atherosclerotic heart disease of native coronary artery without angina pectoris: Secondary | ICD-10-CM | POA: Insufficient documentation

## 2016-08-16 DIAGNOSIS — Z6830 Body mass index (BMI) 30.0-30.9, adult: Secondary | ICD-10-CM | POA: Insufficient documentation

## 2016-08-16 HISTORY — PX: DORSAL COMPARTMENT RELEASE: SHX5039

## 2016-08-16 LAB — POCT I-STAT, CHEM 8
BUN: 14 mg/dL (ref 6–20)
CHLORIDE: 102 mmol/L (ref 101–111)
CREATININE: 0.7 mg/dL (ref 0.61–1.24)
Calcium, Ion: 1.13 mmol/L — ABNORMAL LOW (ref 1.15–1.40)
Glucose, Bld: 121 mg/dL — ABNORMAL HIGH (ref 65–99)
HEMATOCRIT: 44 % (ref 39.0–52.0)
Hemoglobin: 15 g/dL (ref 13.0–17.0)
POTASSIUM: 4.2 mmol/L (ref 3.5–5.1)
Sodium: 140 mmol/L (ref 135–145)
TCO2: 28 mmol/L (ref 0–100)

## 2016-08-16 LAB — GLUCOSE, CAPILLARY: GLUCOSE-CAPILLARY: 125 mg/dL — AB (ref 65–99)

## 2016-08-16 SURGERY — RELEASE, FIRST DORSAL COMPARTMENT, HAND
Anesthesia: General | Site: Wrist | Laterality: Left

## 2016-08-16 MED ORDER — ONDANSETRON HCL 4 MG/2ML IJ SOLN: 4.0000 mg | Freq: Once | INTRAMUSCULAR | Status: DC | PRN

## 2016-08-16 MED ORDER — FENTANYL CITRATE (PF) 100 MCG/2ML IJ SOLN
INTRAMUSCULAR | Status: AC
  Filled 2016-08-16: qty 2

## 2016-08-16 MED ORDER — MIDAZOLAM HCL 2 MG/2ML IJ SOLN: 1.0000 mg | INTRAMUSCULAR | Status: DC | PRN

## 2016-08-16 MED ORDER — ONDANSETRON HCL 4 MG/2ML IJ SOLN
INTRAMUSCULAR | Status: AC
  Filled 2016-08-16: qty 2

## 2016-08-16 MED ORDER — LIDOCAINE 2% (20 MG/ML) 5 ML SYRINGE
INTRAMUSCULAR | Status: AC
  Filled 2016-08-16: qty 5

## 2016-08-16 MED ORDER — HYDROCODONE-ACETAMINOPHEN 5-325 MG PO TABS: 1.0000 | ORAL_TABLET | ORAL | 0 refills | Status: AC | PRN

## 2016-08-16 MED ORDER — LACTATED RINGERS IV SOLN
INTRAVENOUS | Status: DC
  Administered 2016-08-16: 10:00:00 via INTRAVENOUS

## 2016-08-16 MED ORDER — CEFAZOLIN SODIUM-DEXTROSE 2-4 GM/100ML-% IV SOLN
INTRAVENOUS | Status: AC
  Filled 2016-08-16: qty 100

## 2016-08-16 MED ORDER — DEXAMETHASONE SODIUM PHOSPHATE 4 MG/ML IJ SOLN
INTRAMUSCULAR | Status: DC | PRN
  Administered 2016-08-16: 4 mg via INTRAVENOUS

## 2016-08-16 MED ORDER — SCOPOLAMINE 1 MG/3DAYS TD PT72: 1.0000 | MEDICATED_PATCH | Freq: Once | TRANSDERMAL | Status: DC | PRN

## 2016-08-16 MED ORDER — FENTANYL CITRATE (PF) 100 MCG/2ML IJ SOLN: 25.0000 ug | INTRAMUSCULAR | Status: DC | PRN

## 2016-08-16 MED ORDER — PHENYLEPHRINE HCL 10 MG/ML IJ SOLN
INTRAMUSCULAR | Status: DC | PRN
  Administered 2016-08-16: 50 ug/min via INTRAVENOUS

## 2016-08-16 MED ORDER — CEFAZOLIN SODIUM-DEXTROSE 2-4 GM/100ML-% IV SOLN
2.0000 g | INTRAVENOUS | Status: AC
  Administered 2016-08-16: 2 g via INTRAVENOUS

## 2016-08-16 MED ORDER — CHLORHEXIDINE GLUCONATE 4 % EX LIQD: 60.0000 mL | Freq: Once | CUTANEOUS | Status: DC

## 2016-08-16 MED ORDER — PROPOFOL 10 MG/ML IV BOLUS
INTRAVENOUS | Status: DC | PRN
  Administered 2016-08-16: 140 mg via INTRAVENOUS

## 2016-08-16 MED ORDER — DEXAMETHASONE SODIUM PHOSPHATE 10 MG/ML IJ SOLN
INTRAMUSCULAR | Status: AC
  Filled 2016-08-16: qty 1

## 2016-08-16 MED ORDER — BUPIVACAINE HCL (PF) 0.5 % IJ SOLN
INTRAMUSCULAR | Status: DC | PRN
  Administered 2016-08-16: 6 mL

## 2016-08-16 MED ORDER — FENTANYL CITRATE (PF) 100 MCG/2ML IJ SOLN
50.0000 ug | INTRAMUSCULAR | Status: DC | PRN
  Administered 2016-08-16: 50 ug via INTRAVENOUS

## 2016-08-16 MED ORDER — LIDOCAINE 2% (20 MG/ML) 5 ML SYRINGE
INTRAMUSCULAR | Status: DC | PRN
  Administered 2016-08-16: 100 mg via INTRAVENOUS

## 2016-08-16 MED ORDER — LACTATED RINGERS IV SOLN
INTRAVENOUS | Status: DC
  Administered 2016-08-16: 11:00:00 via INTRAVENOUS

## 2016-08-16 SURGICAL SUPPLY — 38 items
BANDAGE ACE 3X5.8 VEL STRL LF (GAUZE/BANDAGES/DRESSINGS) ×3 IMPLANT
BLADE SURG 15 STRL LF DISP TIS (BLADE) ×1 IMPLANT
BLADE SURG 15 STRL SS (BLADE) ×2
BNDG COHESIVE 3X5 TAN STRL LF (GAUZE/BANDAGES/DRESSINGS) ×3 IMPLANT
BNDG ESMARK 4X9 LF (GAUZE/BANDAGES/DRESSINGS) ×3 IMPLANT
CORDS BIPOLAR (ELECTRODE) IMPLANT
COVER BACK TABLE 60X90IN (DRAPES) ×3 IMPLANT
COVER MAYO STAND STRL (DRAPES) ×3 IMPLANT
CUFF TOURNIQUET SINGLE 18IN (TOURNIQUET CUFF) ×3 IMPLANT
DRAPE EXTREMITY T 121X128X90 (DRAPE) ×3 IMPLANT
DRAPE SURG 17X23 STRL (DRAPES) ×3 IMPLANT
DURAPREP 26ML APPLICATOR (WOUND CARE) ×3 IMPLANT
GAUZE SPONGE 4X4 12PLY STRL (GAUZE/BANDAGES/DRESSINGS) ×3 IMPLANT
GAUZE XEROFORM 1X8 LF (GAUZE/BANDAGES/DRESSINGS) ×3 IMPLANT
GLOVE BIOGEL PI IND STRL 7.0 (GLOVE) ×3 IMPLANT
GLOVE BIOGEL PI INDICATOR 7.0 (GLOVE) ×6
GLOVE ECLIPSE 6.5 STRL STRAW (GLOVE) ×3 IMPLANT
GLOVE ECLIPSE 7.0 STRL STRAW (GLOVE) IMPLANT
GLOVE SURG ORTHO 8.0 STRL STRW (GLOVE) ×3 IMPLANT
GOWN STRL REUS W/ TWL LRG LVL3 (GOWN DISPOSABLE) ×1 IMPLANT
GOWN STRL REUS W/ TWL XL LVL3 (GOWN DISPOSABLE) ×2 IMPLANT
GOWN STRL REUS W/TWL LRG LVL3 (GOWN DISPOSABLE) ×2
GOWN STRL REUS W/TWL XL LVL3 (GOWN DISPOSABLE) ×4
NEEDLE HYPO 25X1 1.5 SAFETY (NEEDLE) ×3 IMPLANT
NS IRRIG 1000ML POUR BTL (IV SOLUTION) ×3 IMPLANT
PACK BASIN DAY SURGERY FS (CUSTOM PROCEDURE TRAY) ×3 IMPLANT
PAD CAST 3X4 CTTN HI CHSV (CAST SUPPLIES) ×1 IMPLANT
PADDING CAST ABS 3INX4YD NS (CAST SUPPLIES) ×2
PADDING CAST ABS COTTON 3X4 (CAST SUPPLIES) ×1 IMPLANT
PADDING CAST COTTON 3X4 STRL (CAST SUPPLIES) ×2
SPLINT PLASTER CAST XFAST 3X15 (CAST SUPPLIES) ×5 IMPLANT
SPLINT PLASTER XTRA FASTSET 3X (CAST SUPPLIES) ×10
STOCKINETTE 4X48 STRL (DRAPES) ×3 IMPLANT
SUT ETHILON 3 0 PS 1 (SUTURE) ×3 IMPLANT
SYR BULB 3OZ (MISCELLANEOUS) ×3 IMPLANT
SYR CONTROL 10ML LL (SYRINGE) ×3 IMPLANT
TOWEL OR 17X24 6PK STRL BLUE (TOWEL DISPOSABLE) ×3 IMPLANT
UNDERPAD 30X30 (UNDERPADS AND DIAPERS) IMPLANT

## 2016-08-16 NOTE — Anesthesia Procedure Notes (Signed)
Procedure Name: LMA Insertion Performed by: Chassidy Layson W Pre-anesthesia Checklist: Patient identified, Emergency Drugs available, Suction available and Patient being monitored Patient Re-evaluated:Patient Re-evaluated prior to inductionOxygen Delivery Method: Circle system utilized Preoxygenation: Pre-oxygenation with 100% oxygen Intubation Type: IV induction Ventilation: Mask ventilation without difficulty LMA: LMA inserted LMA Size: 5.0 Number of attempts: 1 Placement Confirmation: positive ETCO2 Tube secured with: Tape Dental Injury: Teeth and Oropharynx as per pre-operative assessment        

## 2016-08-16 NOTE — Anesthesia Postprocedure Evaluation (Signed)
Anesthesia Post Note  Patient: [name]  Procedure(s) Performed: Procedure(s) (LRB): RELEASE DORSAL COMPARTMENT (DEQUERVAIN) LEFT WRIST (Left)  Patient location during evaluation: PACU Anesthesia Type: General Level of consciousness: awake and alert Pain management: pain level controlled Vital Signs Assessment: post-procedure vital signs reviewed and stable Respiratory status: spontaneous breathing, nonlabored ventilation, respiratory function stable and patient connected to nasal cannula oxygen Cardiovascular status: blood pressure returned to baseline and stable Postop Assessment: no signs of nausea or vomiting Anesthetic complications: no    Last Vitals:  Vitals:   08/16/16 1203 08/16/16 1227  BP:  (!) 173/83  Pulse: (!) 49 (!) 48  Resp: 15 16  Temp:  36.7 C    Last Pain:  Vitals:   08/16/16 1227  TempSrc: Oral  PainSc:                  Cecile HearingStephen Edward Briann Sarchet

## 2016-08-16 NOTE — Transfer of Care (Signed)
Immediate Anesthesia Transfer of Care Note  Patient: [name]  Procedure(s) Performed: Procedure(s): RELEASE DORSAL COMPARTMENT (DEQUERVAIN) LEFT WRIST (Left)  Patient Location: PACU  Anesthesia Type:General  Level of Consciousness: awake and sedated  Airway & Oxygen Therapy: Patient Spontanous Breathing and Patient connected to face mask oxygen  Post-op Assessment: Report given to RN and Post -op Vital signs reviewed and stable  Post vital signs: Reviewed and stable  Last Vitals:  Vitals:   08/16/16 1120 08/16/16 1121  BP:  (!) 152/69  Pulse: (!) 51 (!) 51  Resp:  15  Temp:      Last Pain:  Vitals:   08/16/16 1008  TempSrc: Oral  PainSc: 5       Patients Stated Pain Goal: 2 (08/16/16 1008)  Complications: No apparent anesthesia complications

## 2016-08-16 NOTE — Discharge Instructions (Signed)
Do not remove splint.  Do not get splint wet.  May apply ice for up to 20 min a time for pain and swelling.  Fu appt in one week.   Call your surgeon if you experience:   1.  Fever over 101.0. 2.  Inability to urinate. 3.  Nausea and/or vomiting. 4.  Extreme swelling or bruising at the surgical site. 5.  Continued bleeding from the incision. 6.  Increased pain, redness or drainage from the incision. 7.  Problems related to your pain medication. 8.  Any problems and/or concerns    Post Anesthesia Home Care Instructions  Activity: Get plenty of rest for the remainder of the day. A responsible adult should stay with you for 24 hours following the procedure.  For the next 24 hours, DO NOT: -Drive a car -Advertising copywriterperate machinery -Drink alcoholic beverages -Take any medication unless instructed by your physician -Make any legal decisions or sign important papers.  Meals: Start with liquid foods such as gelatin or soup. Progress to regular foods as tolerated. Avoid greasy, spicy, heavy foods. If nausea and/or vomiting occur, drink only clear liquids until the nausea and/or vomiting subsides. Call your physician if vomiting continues.  Special Instructions/Symptoms: Your throat may feel dry or sore from the anesthesia or the breathing tube placed in your throat during surgery. If this causes discomfort, gargle with warm salt water. The discomfort should disappear within 24 hours.  If you had a scopolamine patch placed behind your ear for the management of post- operative nausea and/or vomiting:  1. The medication in the patch is effective for 72 hours, after which it should be removed.  Wrap patch in a tissue and discard in the trash. Wash hands thoroughly with soap and water. 2. You may remove the patch earlier than 72 hours if you experience unpleasant side effects which may include dry mouth, dizziness or visual disturbances. 3. Avoid touching the patch. Wash your hands with soap and water  after contact with the patch.

## 2016-08-16 NOTE — Interval H&P Note (Deleted)
History and Physical Interval Note:  08/16/2016 10:20 AM  [name]  has presented today for surgery, with the diagnosis of other synovitis and tenosynovitis left hand  M65.842  The various methods of treatment have been discussed with the patient and family. After consideration of risks, benefits and other options for treatment, the patient has consented to  Procedure(s): RELEASE DORSAL COMPARTMENT (DEQUERVAIN) LEFT WRIST (Left) as a surgical intervention .  The patient's history has been reviewed, patient examined, no change in status, stable for surgery.  I have reviewed the patient's chart and labs.  Questions were answered to the patient's satisfaction.     Loreta Aveaniel F [place] Defina

## 2016-08-16 NOTE — Anesthesia Procedure Notes (Signed)
Procedure Name: Intubation Performed by: York GricePEARSON, Saif Peter W Pre-anesthesia Checklist: Patient identified, Emergency Drugs available, Suction available and Patient being monitored Patient Re-evaluated:Patient Re-evaluated prior to inductionOxygen Delivery Method: Circle system utilized Preoxygenation: Pre-oxygenation with 100% oxygen Intubation Type: IV induction Ventilation: Mask ventilation without difficulty LMA: LMA inserted LMA Size: 4.0 Tube type: Oral Number of attempts: 1 Airway Equipment and Method: Stylet and Oral airway Placement Confirmation: ETT inserted through vocal cords under direct vision,  positive ETCO2 and breath sounds checked- equal and bilateral Tube secured with: Tape Dental Injury: Teeth and Oropharynx as per pre-operative assessment

## 2016-08-16 NOTE — Anesthesia Preprocedure Evaluation (Addendum)
Anesthesia Evaluation  Patient identified by MRN, date of birth, ID band Patient awake    Reviewed: Allergy & Precautions, NPO status , Patient's Chart, lab work & pertinent test results, reviewed documented beta blocker date and time   History of Anesthesia Complications (+) PROLONGED EMERGENCE and history of anesthetic complications  Airway Mallampati: I  TM Distance: >3 FB Neck ROM: Full    Dental  (+) Dental Advisory Given, Lower Dentures, Upper Dentures   Pulmonary former smoker,    Pulmonary exam normal breath sounds clear to auscultation       Cardiovascular hypertension, Pt. on medications and Pt. on home beta blockers (-) angina+ CAD, + Past MI, + CABG and + Peripheral Vascular Disease  Normal cardiovascular exam Rhythm:Regular Rate:Normal     Neuro/Psych S/p ACDF CVA, No Residual Symptoms negative psych ROS   GI/Hepatic negative GI ROS, Neg liver ROS,   Endo/Other  diabetes, Type 2Obesity   Renal/GU negative Renal ROS     Musculoskeletal  (+) Arthritis , Osteoarthritis,    Abdominal   Peds  Hematology negative hematology ROS (+)   Anesthesia Other Findings Day of surgery medications reviewed with the patient.  Reproductive/Obstetrics                           Anesthesia Physical Anesthesia Plan  ASA: III  Anesthesia Plan: General   Post-op Pain Management:    Induction: Intravenous  Airway Management Planned: LMA  Additional Equipment:   Intra-op Plan:   Post-operative Plan: Extubation in OR  Informed Consent: I have reviewed the patients History and Physical, chart, labs and discussed the procedure including the risks, benefits and alternatives for the proposed anesthesia with the patient or authorized representative who has indicated his/her understanding and acceptance.   Dental advisory given  Plan Discussed with: CRNA  Anesthesia Plan Comments:  (Risks/benefits of general anesthesia discussed with patient including risk of damage to teeth, lips, gum, and tongue, nausea/vomiting, allergic reactions to medications, and the possibility of heart attack, stroke and death.  All patient questions answered.  Patient wishes to proceed.)        Anesthesia Quick Evaluation

## 2016-08-17 ENCOUNTER — Encounter (HOSPITAL_BASED_OUTPATIENT_CLINIC_OR_DEPARTMENT_OTHER): Payer: Self-pay | Admitting: Orthopedic Surgery

## 2016-08-20 NOTE — Op Note (Signed)
NAME:  [name], [name]            ACCOUNT NO.:  [id number]  MEDICAL RECORD NO.:  [id number]  LOCATION:                                 FACILITY:  PHYSICIAN:  Loreta Aveaniel F. Keegen Heffern, M.D. 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  POSTOPERATIVE DIAGNOSIS:  Longstanding worsening de Quervain tenosynovitis 1st dorsal compartment, left wrist, with marked fluid tenosynovitis throughout.  PROCEDURE:  Release 1st dorsal compartment, left wrist.  SURGEON:  Loreta Aveaniel F. Jaymz Traywick, M.D.  ASSISTANT:  Mikey KirschnerLindsey Stanberry, PA.  ANESTHESIA:  General.  BLOOD LOSS:  Minimal.  SPECIMENS:  None.  CULTURES:  None.  COMPLICATIONS:  None.  DRESSINGS:  Soft compressive thumb spica splint.  DESCRIPTION OF PROCEDURE:  The patient was brought to the operating room, and after adequate anesthesia had been obtained, tourniquet applied to the left arm.  Prepped and draped in usual sterile fashion. Exsanguinated with elevation of Esmarch.  Tourniquet inflated to 250 mmHg.  Longitudinal incision over the 1st dorsal compartment.  This was then isolated, protected neurovascular structures.  Released in its entirety from proximal to distal.  Marked fluid tenosynovitis throughout all debrided.  There were 2 main compartments.  They were completely released, and care was taken to be sure there were no other compartments.  Wound irrigated.  Closed with nylon.  Margins were injected with Marcaine.  Sterile compressive dressing applied.  Bulky hand dressing and thumb spica splint. Tourniquet deflated and removed.  Anesthesia reversed.  Brought to the recovery room.  Tolerated the surgery well.  No complications.     Loreta Aveaniel F. Jayline Kilburg, M.D.     DFM/MEDQ  D:  08/16/2016  T:  08/17/2016  Job:  [id number]

## 2016-08-22 NOTE — H&P (Signed)
This is a pleasant 82 year old gentleman who presents to our clinic today with recurrent left wrist pain. We saw [name] back on 07/03/2016 with calcified De Quervain's tenosynovitis of the left wrist. At that point we proceeded with a Celestone/Xylocaine injection which he states did not help at all. In fact, his pain has worsened and he would like to proceed with definitive treatment.   Past Medical, Family and Social History reviewed in detail on patient questionnaire and signed.   Review of systems as detailed on HPI; all others reviewed and are negative.    EXAMINATION: Well-developed, well-nourished male in no acute distress. Alert and oriented x 3. Heart sounds normal.  Lungs clear to auscultation bilaterally.Examination of his left wrist reveals marked tenderness over the De Quervain's tendon sheath. Positive Finkelstein's. He is neurovascularly intact distally.   IMPRESSION: Left wrist calcified De Quervain's tenosynovitis.   PLAN: At this point definitive treatment is a left wrist De Quervain's release. [name] would like to proceed. Risks, benefits, and possible complications reviewed and rehab recovery time discussed. All questions were answered. We will see [name] at the time of operative intervention.

## 2017-07-18 ENCOUNTER — Other Ambulatory Visit: Payer: Self-pay | Admitting: Neurosurgery

## 2017-07-18 DIAGNOSIS — M542 Cervicalgia: Secondary | ICD-10-CM

## 2017-07-29 ENCOUNTER — Ambulatory Visit
Admission: RE | Admit: 2017-07-29 | Discharge: 2017-07-29 | Disposition: A | Discharge status: Home / Self Care | Payer: Medicare HMO | Source: Ambulatory Visit | Attending: Neurosurgery | Admitting: Neurosurgery

## 2017-07-29 DIAGNOSIS — M542 Cervicalgia: Secondary | ICD-10-CM

## 2019-04-12 IMAGING — MR MR CERVICAL SPINE W/O CM
4 of 5 series · 22 of 48 positions shown · non-contrast
Comparison: No prior cervical spine imaging. Head CT without
contrast 10/17/2016.

CLINICAL DATA: 84-year-old male with personal history of multiple
spine surgeries.Neck pain radiating to the right arm and hand with
numbness and weakness.

EXAM:
MRI CERVICAL SPINE WITHOUT CONTRAST
TECHNIQUE: Multiplanar, multisequence MR imaging of the cervical spine was
performed. No intravenous contrast was administered.

[Series 3: T2 post-contrast · sagittal · 3.3mm · 0.37mm/px · 6 of 13 slices shown]
[im 1/13]
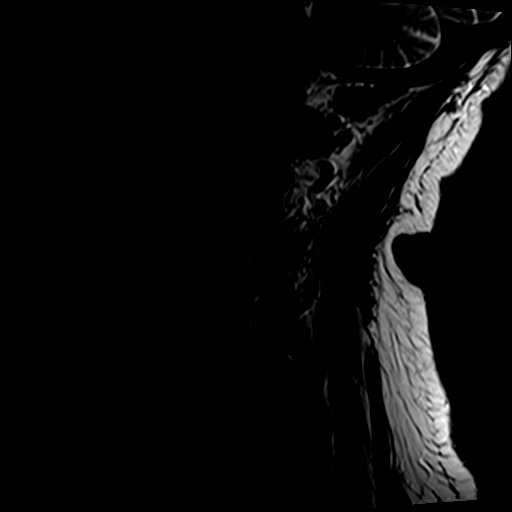
[im 3/13]
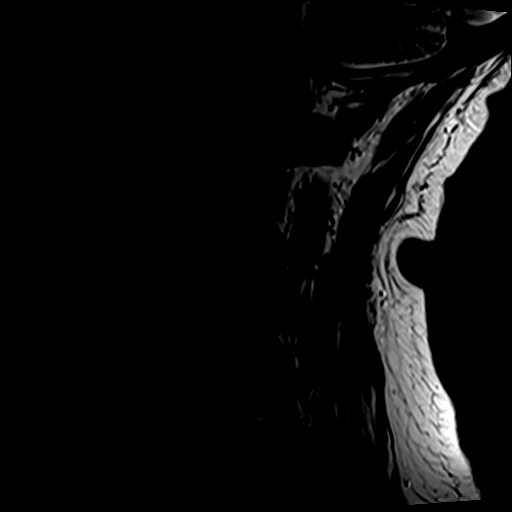
[im 5/13]
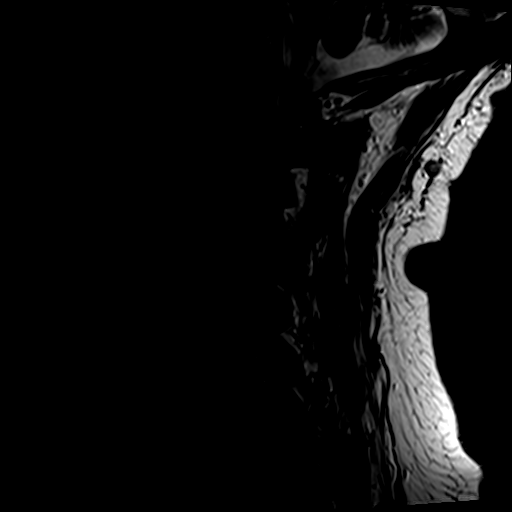
[im 8/13]
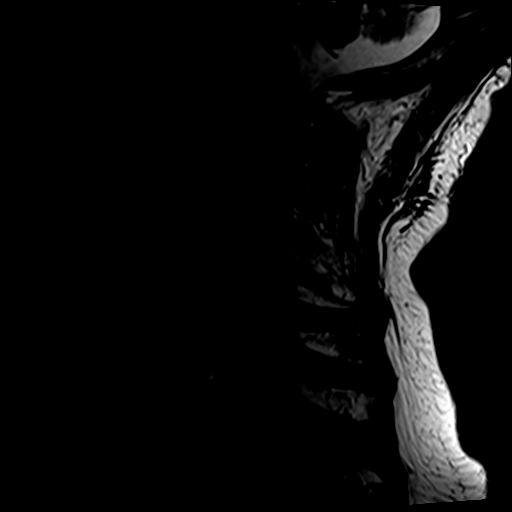
[im 10/13]
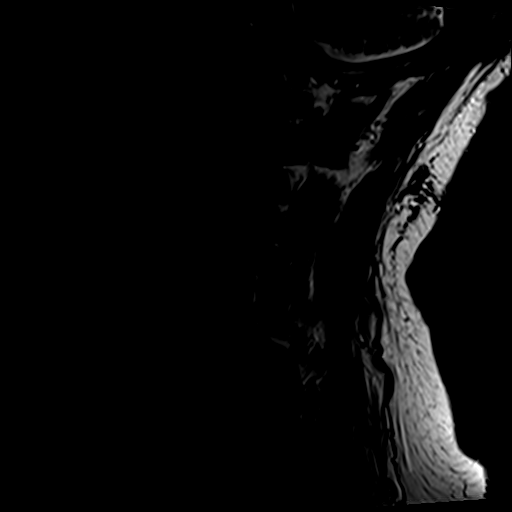
[im 13/13]
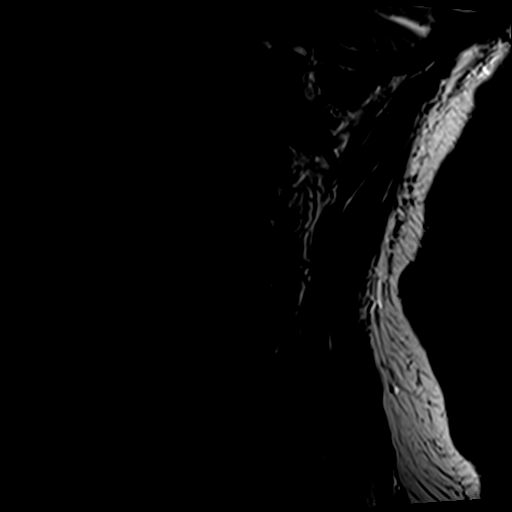

[Series 4: T1 · sagittal · 3.3mm · 0.37mm/px · 5 of 13 slices shown]
[im 1/13]
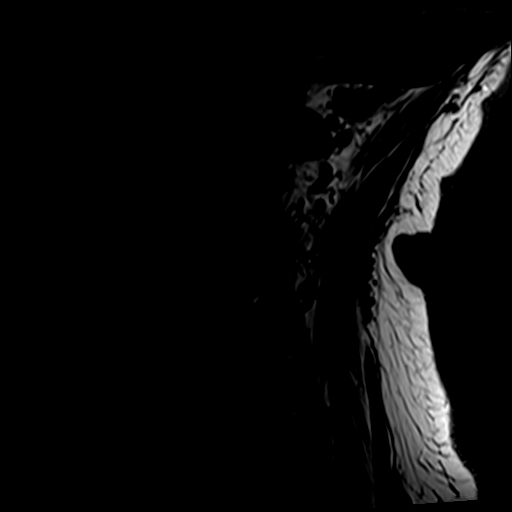
[im 3/13]
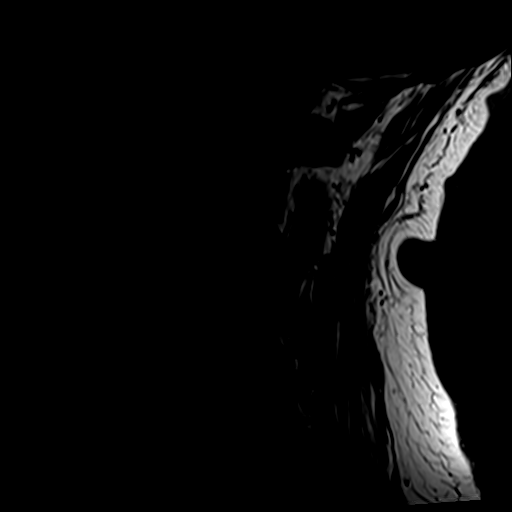
[im 5/13]
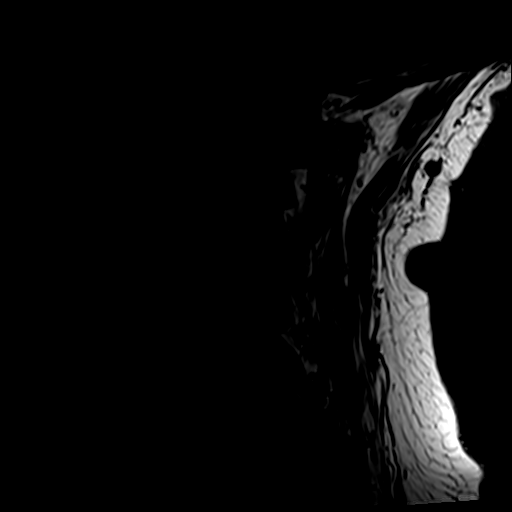
[im 7/13]
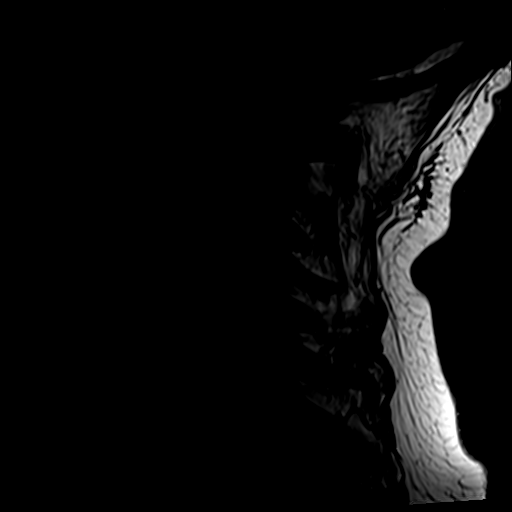
[im 11/13]
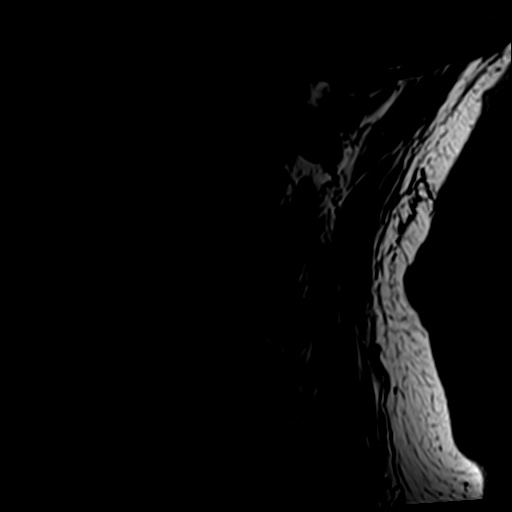

[Series 6: GRE · axial · 3.0mm · 0.35mm/px · z∈[-70,-5]mm · 3 of 26 slices shown]
[im 4/26]
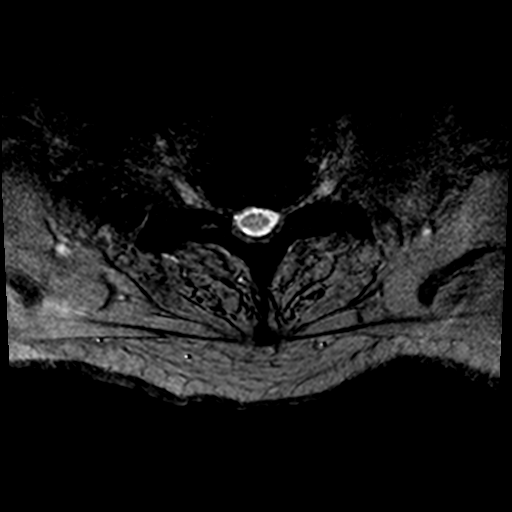
[im 14/26]
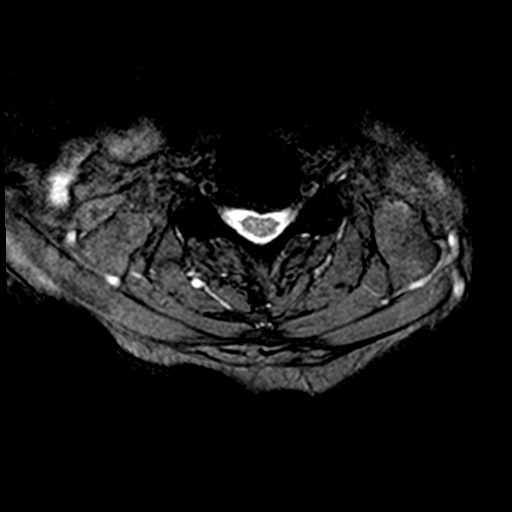
[im 22/26]
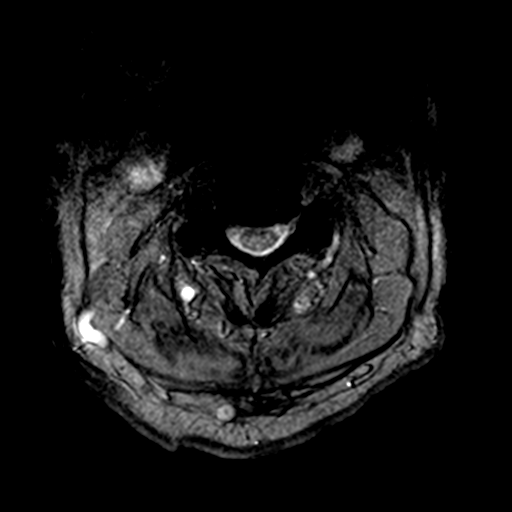

[Series 7: T2 · axial · 3.0mm · 0.70mm/px · z∈[-81,+10]mm · 8 of 26 slices shown]
[im 1/26]
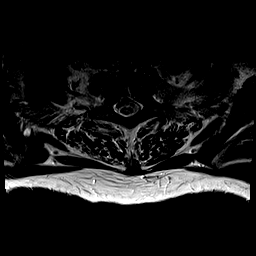
[im 4/26]
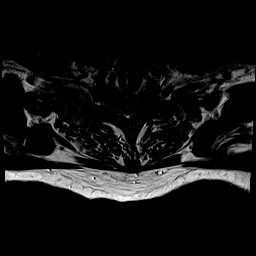
[im 8/26]
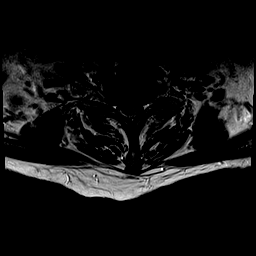
[im 12/26]
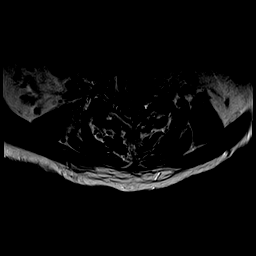
[im 14/26]
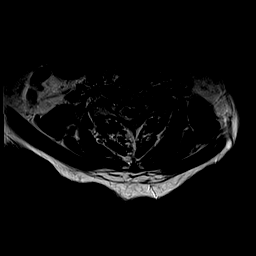
[im 18/26]
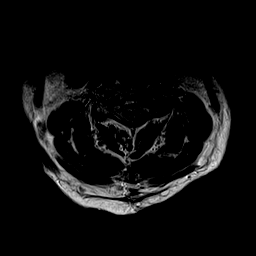
[im 22/26]
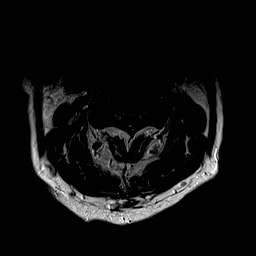
[im 26/26]
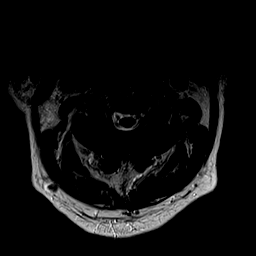

[22 of 48 positions shown; findings below may reference images not displayed]

FINDINGS: Study is intermittently degraded by motion artifact despite repeated
imaging attempts.

Alignment: Straightening of cervical lordosis. Subtle
anterolisthesis of T1 on T2. Subtle retrolisthesis of C3 on C4.

Vertebrae: Mild susceptibility artifact related to multilevel
cervical spine ACDF hardware. Background bone marrow signal appears
normal. There is probably mild degenerative endplate marrow edema
anteriorly at C7-T1 (series 8, image 8). No other marrow edema or
acute osseous abnormality.

Cord: Possible spinal cord myelomalacia at the C4-C5 level (series
6, image 11 and series 3, image 7). No definite spinal cord signal
abnormality elsewhere.

Posterior Fossa, vertebral arteries, paraspinal tissues:
Cervicomedullary junction is within normal limits. Stable visible
posterior fossa. Preserved major vascular flow voids in the neck.

Questionable T2 hyperintensity an enlargement of the right thyroid
lobe extending into the right retropharyngeal space to the C6 level
(series 8, image 2). Less likely this might be a chronic
retropharyngeal or prevertebral fluid collection.

Disc levels:

C2-C3: Mild circumferential disc bulge and endplate spurring with
broad-based posterior and foraminal components. Moderate facet
hypertrophy greater on the right. Mild to moderate ligament flavum
hypertrophy. Spinal stenosis without spinal cord mass effect.
Moderate left and moderate to severe right C3 foraminal stenosis.

C3-C4: Prior ACDF. Circumferential disc osteophyte complex with
broad-based posterior and foraminal components. Mild to moderate
facet and ligament flavum hypertrophy. Spinal stenosis with up to
mild spinal cord mass effect. Moderate to severe bilateral C4
foraminal stenosis greater on the right.

C4-C5: Prior ACDF with solid interbody arthrodesis. Residual
circumferential endplate spurring. Mild to moderate facet
hypertrophy. Right versus right greater than left facet ankylosis.
Mild to moderate ligament flavum hypertrophy. Spinal stenosis with
mild spinal cord mass effect. Mild to moderate left C5 foraminal
stenosis.

C5-C6: Prior ACDF. Solid arthrodesis. Circumferential endplate
spurring. Mild facet and left ligament flavum hypertrophy. STIR
borderline to mild spinal and bilateral C6 foraminal stenosis.

C6-C7: Prior ACDF. Circumferential disc osteophyte complex with
broad-based posterior and foraminal components. Mild to moderate
facet and ligament flavum hypertrophy. Spinal stenosis with no
spinal cord mass effect. Moderate bilateral C7 foraminal stenosis
greater on the left.

C7-T1: Severe disc space loss with circumferential disc osteophyte
complex. Broad-based posterior and foraminal components. Moderate
facet and ligament flavum hypertrophy. Spinal stenosis with no
definite spinal cord mass effect. Moderate to severe right greater
than left C8 neural foraminal stenosis.

Mild upper thoracic spinal stenosis at T2-T3 appears related to
chronic disc, endplate, and posterior element degeneration.
IMPRESSION: 1. Partially visible increased T2 and STIR signal in the right
prevertebral or retropharyngeal space up to the C6 level. This is
nonspecific but consider thyroid enlargement. Neck CT with IV
contrast would best evaluate further.
2. Prior cervical ACDF from the C3 to the C7 levels. Solid
arthrodesis at C4-C5 and C5-C6.
3. Adjacent segment disease at C2-C3 and C7-T1 with advanced disc,
endplate, and posterior element degeneration. Superimposed mild
acute endplate degeneration anteriorly at C7-T1.
4. Mostly mild residual cervical spinal stenosis throughout.
Possible mild spinal cord mass effect at C3-C4 and C4-C5. Possible
chronic spinal cord myelomalacia at C4-C5.
5. Moderate or severe neural foraminal stenosis at the bilateral C3,
C4, C7, and C8 nerve levels.

## 2021-11-23 ENCOUNTER — Other Ambulatory Visit: Payer: Self-pay | Admitting: Neurosurgery

## 2021-11-23 DIAGNOSIS — M5412 Radiculopathy, cervical region: Secondary | ICD-10-CM

## 2021-12-19 ENCOUNTER — Other Ambulatory Visit: Payer: Medicare HMO

## 2023-12-03 ENCOUNTER — Other Ambulatory Visit (HOSPITAL_COMMUNITY): Payer: Self-pay | Admitting: Orthopedic Surgery

## 2023-12-03 DIAGNOSIS — M25562 Pain in left knee: Secondary | ICD-10-CM

## 2024-01-02 ENCOUNTER — Other Ambulatory Visit (HOSPITAL_COMMUNITY): Payer: Self-pay | Admitting: Orthopedic Surgery

## 2024-01-02 DIAGNOSIS — M25562 Pain in left knee: Secondary | ICD-10-CM

## 2024-01-10 ENCOUNTER — Ambulatory Visit (HOSPITAL_COMMUNITY)
Admission: RE | Admit: 2024-01-10 | Discharge: 2024-01-10 | Disposition: A | Discharge status: Home / Self Care | Source: Ambulatory Visit | Attending: Orthopedic Surgery | Admitting: Orthopedic Surgery

## 2024-01-10 ENCOUNTER — Ambulatory Visit (HOSPITAL_COMMUNITY)
Admission: RE | Admit: 2024-01-10 | Discharge: 2024-01-10 | Disposition: A | Discharge status: Home / Self Care | Source: Ambulatory Visit | Attending: Physician Assistant | Admitting: Physician Assistant

## 2024-01-10 DIAGNOSIS — M25562 Pain in left knee: Secondary | ICD-10-CM | POA: Diagnosis present

## 2024-01-10 NOTE — CV Procedure (Addendum)
  Device system confirmed to be MRI conditional, with implant date > 6 weeks ago, and no evidence of abandoned or epicardial leads in review of most recent CXR  Device last cleared by EP Provider: Mertha Abrahams cleared 01/02/24; chest x-ray cleared 01/10/24  Clearance is good through for 1 year as long as parameters remain stable at time of check. If pt undergoes a cardiac device procedure during that time, they should be re-cleared.   Tachy-therapies to be programmed off if applicable with device back to pre-MRI settings after completion of exam.  [employer] - Programming recommendation received through [employer] App[employer]Tablet  Arlys Lamer, RT  01/10/2024 1:00 PM

## 2024-09-25 ENCOUNTER — Other Ambulatory Visit (HOSPITAL_COMMUNITY): Payer: Self-pay | Admitting: Orthopedic Surgery

## 2024-09-25 ENCOUNTER — Ambulatory Visit (HOSPITAL_COMMUNITY)
Admission: RE | Admit: 2024-09-25 | Discharge: 2024-09-25 | Disposition: A | Source: Ambulatory Visit | Attending: Vascular Surgery

## 2024-09-25 DIAGNOSIS — R2241 Localized swelling, mass and lump, right lower limb: Secondary | ICD-10-CM | POA: Insufficient documentation
